# Patient Record
Sex: Female | Born: 1980 | Race: White | Hispanic: No | Smoking: Never smoker
Health system: Southern US, Community
[De-identification: ages and names within clinical notes are randomized; demographics above are authoritative.]

## PROBLEM LIST (undated history)

## (undated) DIAGNOSIS — F909 Attention-deficit hyperactivity disorder, unspecified type: Secondary | ICD-10-CM

## (undated) DIAGNOSIS — F419 Anxiety disorder, unspecified: Secondary | ICD-10-CM

## (undated) DIAGNOSIS — F32A Depression, unspecified: Secondary | ICD-10-CM

## (undated) DIAGNOSIS — F329 Major depressive disorder, single episode, unspecified: Secondary | ICD-10-CM

## (undated) DIAGNOSIS — N3010 Interstitial cystitis (chronic) without hematuria: Secondary | ICD-10-CM

## (undated) DIAGNOSIS — K219 Gastro-esophageal reflux disease without esophagitis: Secondary | ICD-10-CM

## (undated) DIAGNOSIS — M797 Fibromyalgia: Secondary | ICD-10-CM

## (undated) DIAGNOSIS — T7840XA Allergy, unspecified, initial encounter: Secondary | ICD-10-CM

## (undated) HISTORY — PX: ABDOMINAL HYSTERECTOMY: SHX81

## (undated) HISTORY — DX: Depression, unspecified: F32.A

## (undated) HISTORY — PX: CHOLECYSTECTOMY: SHX55

## (undated) HISTORY — DX: Major depressive disorder, single episode, unspecified: F32.9

## (undated) HISTORY — PX: AUGMENTATION MAMMAPLASTY: SUR837

## (undated) HISTORY — PX: ARTHROSCOPIC REPAIR ACL: SUR80

## (undated) HISTORY — DX: Allergy, unspecified, initial encounter: T78.40XA

## (undated) HISTORY — DX: Anxiety disorder, unspecified: F41.9

## (undated) HISTORY — DX: Attention-deficit hyperactivity disorder, unspecified type: F90.9

## (undated) HISTORY — PX: APPENDECTOMY: SHX54

## (undated) HISTORY — DX: Gastro-esophageal reflux disease without esophagitis: K21.9

## (undated) HISTORY — DX: Fibromyalgia: M79.7

---

## 2007-03-02 ENCOUNTER — Inpatient Hospital Stay (HOSPITAL_COMMUNITY): Admission: AD | Admit: 2007-03-02 | Discharge: 2007-03-03 | Payer: Self-pay | Admitting: Obstetrics & Gynecology

## 2007-03-13 ENCOUNTER — Emergency Department (HOSPITAL_COMMUNITY): Admission: EM | Admit: 2007-03-13 | Discharge: 2007-03-13 | Payer: Self-pay | Admitting: Emergency Medicine

## 2007-03-14 ENCOUNTER — Emergency Department (HOSPITAL_COMMUNITY): Admission: EM | Admit: 2007-03-14 | Discharge: 2007-03-15 | Payer: Self-pay | Admitting: Emergency Medicine

## 2007-04-02 ENCOUNTER — Ambulatory Visit: Payer: Self-pay | Admitting: Obstetrics & Gynecology

## 2007-04-19 ENCOUNTER — Emergency Department (HOSPITAL_COMMUNITY): Admission: EM | Admit: 2007-04-19 | Discharge: 2007-04-20 | Payer: Self-pay | Admitting: Emergency Medicine

## 2007-05-08 ENCOUNTER — Ambulatory Visit: Payer: Self-pay | Admitting: Obstetrics & Gynecology

## 2007-05-08 ENCOUNTER — Ambulatory Visit (HOSPITAL_COMMUNITY): Admission: RE | Admit: 2007-05-08 | Discharge: 2007-05-08 | Payer: Self-pay | Admitting: Obstetrics & Gynecology

## 2007-05-08 ENCOUNTER — Encounter: Payer: Self-pay | Admitting: Obstetrics & Gynecology

## 2007-05-12 ENCOUNTER — Ambulatory Visit: Payer: Self-pay | Admitting: Obstetrics & Gynecology

## 2008-06-29 ENCOUNTER — Encounter: Admission: RE | Admit: 2008-06-29 | Discharge: 2008-06-29 | Payer: Self-pay | Admitting: Sports Medicine

## 2008-07-06 ENCOUNTER — Encounter: Admission: RE | Admit: 2008-07-06 | Discharge: 2008-08-25 | Payer: Self-pay | Admitting: Sports Medicine

## 2010-12-16 DIAGNOSIS — M797 Fibromyalgia: Secondary | ICD-10-CM

## 2010-12-16 HISTORY — DX: Fibromyalgia: M79.7

## 2011-04-30 NOTE — Op Note (Signed)
[name], Erica Nichols            ACCOUNT NO.:  [id number]   MEDICAL RECORD NO.:  [id number]          PATIENT TYPE:  AMB   LOCATION:  SDC                           FACILITY:  [facility]   PHYSICIAN:  Allie Bossier, MD        DATE OF BIRTH:  29-Dec-1980   DATE OF PROCEDURE:  05/08/2007  DATE OF DISCHARGE:                               OPERATIVE REPORT   PREOPERATIVE DIAGNOSIS:  Chronic pelvic pain.   POSTOPERATIVE DIAGNOSIS:  Chronic pelvic pain.   PROCEDURE:  Diagnostic laparoscopy.   SURGEON:  Clarisa Kindred, M.D.   ANESTHESIA:  Oda Cogan, M.D.   COMPLICATIONS:  None.   EBL:  Minimal.   SPECIMENS:  Biopsy of left ureterosacral ligament.   DETAILED PROCEDURE FINDINGS:  Preoperatively, the risks, benefits and  alternatives of surgery were explained to her and accepted, consents  were signed.  She was taken to the operating room and placed in the  dorsal lithotomy position.  General anesthesia was then applied.  Her  vagina and abdomen were prepped and draped in the usual sterile fashion.  Her bladder was emptied with a Robinson catheter.  A bimanual exam  revealed a normal sized and shaped mid plane, very mobile uterus and non-  enlarged adnexa.  A Hulka manipulator was placed.  Gloves were changed,  attention was turned to the abdomen.  A vertical umbilical incision was  made, taking care to avoid the scar of her previous umbilical hernia  repair.  A Veress needle was placed.  Low flow CO2 was used to  insufflate the abdomen to 3L.  Patient abdominal pressure was always  less than 10.  A 10 mm trocar was placed.  Laparoscopy confirmed correct  placement.  She was placed in Trendelenburg position.  The pelvis was  inspected.  There were no gross abnormalities on initial exam and I  placed a 5 mm port in the midline lower pelvis under direct laparoscopic  visualization.  The pelvis was more carefully examined.  The right ovary  had a simple cyst that measured approximately 1-2 cm.   The fimbria of  her ova duct appeared normal.  There was no evidence of endometriosis on  the patient's right hand side.  There was a minimal amount of clear  fluid in the cul-de-sac.  The left ureterosacral ligament, near its  junction into the uterus, had a lesion that was questionable for  endometriosis, this was biopsied and removed in its entirety during the  course of the biopsy.  The left ovary appeared entirely normal as did  its fimbria.  Anterior cul-de-sac was normal as well.  Throughout the  peritoneum there was some moderate amount of injection of the  capillaries.  This would be consistent with her history of PID in the  past.  The upper abdomen appeared normal.  There was minimal scarring  from her previous appendectomy.  The 5 mm port was removed after  hemostasis was noted at the biopsy site.  The CO2 was allowed to escape  from the abdomen and the 10 mm port was removed.  The  fascia at the  umbilicus was closed with a #0 Vicryl suture.  A total of 10 mL of 0.5%  Marcaine was injected in the subcutaneous tissue between the 2 incision  sites.  The subcuticular closure at the umbilicus was done  with a #4-0 Vicryl running and locking suture and Dermabond was placed  at both incisions for the skin.  She was extubated and taken to the  recovery room in stable condition.  Please note the Hulka manipulator  was removed.  She tolerated the procedure well.      Allie Bossier, MD  Electronically Signed     MCD/MEDQ  D:  05/08/2007  T:  05/08/2007  Job:  [id number]

## 2012-06-01 DIAGNOSIS — Z87440 Personal history of urinary (tract) infections: Secondary | ICD-10-CM | POA: Insufficient documentation

## 2013-09-08 DIAGNOSIS — IMO0002 Reserved for concepts with insufficient information to code with codable children: Secondary | ICD-10-CM | POA: Insufficient documentation

## 2013-09-08 DIAGNOSIS — M797 Fibromyalgia: Secondary | ICD-10-CM | POA: Insufficient documentation

## 2013-09-08 DIAGNOSIS — M545 Low back pain, unspecified: Secondary | ICD-10-CM | POA: Insufficient documentation

## 2013-09-08 DIAGNOSIS — M064 Inflammatory polyarthropathy: Secondary | ICD-10-CM | POA: Insufficient documentation

## 2013-10-06 DIAGNOSIS — R5383 Other fatigue: Secondary | ICD-10-CM | POA: Insufficient documentation

## 2013-10-06 DIAGNOSIS — G4700 Insomnia, unspecified: Secondary | ICD-10-CM | POA: Insufficient documentation

## 2013-11-05 DIAGNOSIS — M2550 Pain in unspecified joint: Secondary | ICD-10-CM | POA: Insufficient documentation

## 2015-09-25 DIAGNOSIS — B009 Herpesviral infection, unspecified: Secondary | ICD-10-CM | POA: Insufficient documentation

## 2016-07-21 ENCOUNTER — Emergency Department (HOSPITAL_COMMUNITY)
Admission: EM | Admit: 2016-07-21 | Disposition: A | Discharge status: Home / Self Care | Payer: Medicaid Other | Source: Home / Self Care

## 2016-07-21 ENCOUNTER — Encounter (HOSPITAL_COMMUNITY): Payer: Self-pay

## 2016-07-21 ENCOUNTER — Emergency Department (HOSPITAL_COMMUNITY)
Admission: EM | Admit: 2016-07-21 | Discharge: 2016-07-21 | Disposition: A | Discharge status: Home / Self Care | Payer: Medicaid Other | Attending: Dermatology | Admitting: Dermatology

## 2016-07-21 DIAGNOSIS — R110 Nausea: Secondary | ICD-10-CM | POA: Insufficient documentation

## 2016-07-21 DIAGNOSIS — Z87891 Personal history of nicotine dependence: Secondary | ICD-10-CM | POA: Insufficient documentation

## 2016-07-21 DIAGNOSIS — R1011 Right upper quadrant pain: Secondary | ICD-10-CM | POA: Diagnosis present

## 2016-07-21 DIAGNOSIS — Z5321 Procedure and treatment not carried out due to patient leaving prior to being seen by health care provider: Secondary | ICD-10-CM | POA: Insufficient documentation

## 2016-07-21 LAB — URINALYSIS, ROUTINE W REFLEX MICROSCOPIC
BILIRUBIN URINE: NEGATIVE
GLUCOSE, UA: NEGATIVE mg/dL
Ketones, ur: NEGATIVE mg/dL
LEUKOCYTES UA: NEGATIVE
NITRITE: NEGATIVE
PH: 7.5 (ref 5.0–8.0)
Protein, ur: NEGATIVE mg/dL
SPECIFIC GRAVITY, URINE: 1.01 (ref 1.005–1.030)

## 2016-07-21 LAB — URINE MICROSCOPIC-ADD ON: Bacteria, UA: NONE SEEN

## 2016-07-21 NOTE — ED Notes (Signed)
No answer when called to patient room 

## 2016-07-21 NOTE — ED Triage Notes (Signed)
No answer when called to patient room 

## 2016-07-21 NOTE — ED Triage Notes (Signed)
RUQ pain with nausea. Patient states no relief with OTC medications.

## 2016-07-22 ENCOUNTER — Encounter (HOSPITAL_COMMUNITY): Payer: Self-pay | Admitting: *Deleted

## 2016-07-22 ENCOUNTER — Emergency Department (HOSPITAL_COMMUNITY)
Admission: EM | Admit: 2016-07-22 | Discharge: 2016-07-22 | Disposition: A | Discharge status: Home / Self Care | Payer: Medicaid Other | Attending: Emergency Medicine | Admitting: Emergency Medicine

## 2016-07-22 ENCOUNTER — Emergency Department (HOSPITAL_COMMUNITY): Payer: Medicaid Other

## 2016-07-22 DIAGNOSIS — R1011 Right upper quadrant pain: Secondary | ICD-10-CM | POA: Diagnosis present

## 2016-07-22 DIAGNOSIS — Z87891 Personal history of nicotine dependence: Secondary | ICD-10-CM | POA: Diagnosis not present

## 2016-07-22 DIAGNOSIS — Z79899 Other long term (current) drug therapy: Secondary | ICD-10-CM | POA: Diagnosis not present

## 2016-07-22 DIAGNOSIS — R0789 Other chest pain: Secondary | ICD-10-CM

## 2016-07-22 HISTORY — DX: Interstitial cystitis (chronic) without hematuria: N30.10

## 2016-07-22 LAB — URINE MICROSCOPIC-ADD ON
Bacteria, UA: NONE SEEN
WBC, UA: NONE SEEN WBC/hpf (ref 0–5)

## 2016-07-22 LAB — CBC WITH DIFFERENTIAL/PLATELET
BASOS PCT: 0 %
Basophils Absolute: 0 10*3/uL (ref 0.0–0.1)
Eosinophils Absolute: 0.2 10*3/uL (ref 0.0–0.7)
Eosinophils Relative: 2 %
HEMATOCRIT: 42.6 % (ref 36.0–46.0)
Hemoglobin: 14.7 g/dL (ref 12.0–15.0)
LYMPHS PCT: 43 %
Lymphs Abs: 2.7 10*3/uL (ref 0.7–4.0)
MCH: 29.7 pg (ref 26.0–34.0)
MCHC: 34.5 g/dL (ref 30.0–36.0)
MCV: 86.1 fL (ref 78.0–100.0)
MONO ABS: 0.5 10*3/uL (ref 0.1–1.0)
MONOS PCT: 7 %
NEUTROS ABS: 2.9 10*3/uL (ref 1.7–7.7)
Neutrophils Relative %: 48 %
Platelets: 259 10*3/uL (ref 150–400)
RBC: 4.95 MIL/uL (ref 3.87–5.11)
RDW: 12.1 % (ref 11.5–15.5)
WBC: 6.1 10*3/uL (ref 4.0–10.5)

## 2016-07-22 LAB — URINALYSIS, ROUTINE W REFLEX MICROSCOPIC
GLUCOSE, UA: NEGATIVE mg/dL
KETONES UR: NEGATIVE mg/dL
Leukocytes, UA: NEGATIVE
Nitrite: NEGATIVE
PH: 5.5 (ref 5.0–8.0)
Protein, ur: NEGATIVE mg/dL
Specific Gravity, Urine: 1.02 (ref 1.005–1.030)

## 2016-07-22 LAB — COMPREHENSIVE METABOLIC PANEL
ALK PHOS: 62 U/L (ref 38–126)
ALT: 18 U/L (ref 14–54)
AST: 17 U/L (ref 15–41)
Albumin: 4.3 g/dL (ref 3.5–5.0)
Anion gap: 4 — ABNORMAL LOW (ref 5–15)
BUN: 18 mg/dL (ref 6–20)
CHLORIDE: 106 mmol/L (ref 101–111)
CO2: 27 mmol/L (ref 22–32)
CREATININE: 0.92 mg/dL (ref 0.44–1.00)
Calcium: 8.6 mg/dL — ABNORMAL LOW (ref 8.9–10.3)
GFR calc Af Amer: >60 mL/min (ref >60)
Glucose, Bld: 88 mg/dL (ref 65–99)
Potassium: 3.8 mmol/L (ref 3.5–5.1)
Sodium: 137 mmol/L (ref 135–145)
TOTAL PROTEIN: 7.4 g/dL (ref 6.5–8.1)
Total Bilirubin: 0.7 mg/dL (ref 0.3–1.2)

## 2016-07-22 LAB — PREGNANCY, URINE: Preg Test, Ur: NEGATIVE

## 2016-07-22 LAB — LIPASE, BLOOD: Lipase: 34 U/L (ref 11–51)

## 2016-07-22 MED ORDER — PROMETHAZINE HCL 25 MG PO TABS: 25.0000 mg | ORAL_TABLET | Freq: Four times a day (QID) | ORAL | 1 refills | Status: DC | PRN

## 2016-07-22 MED ORDER — SODIUM CHLORIDE 0.9 % IV BOLUS (SEPSIS)
1000.0000 mL | Freq: Once | INTRAVENOUS | Status: AC
  Administered 2016-07-22: 1000 mL via INTRAVENOUS

## 2016-07-22 MED ORDER — IOPAMIDOL (ISOVUE-300) INJECTION 61%
100.0000 mL | Freq: Once | INTRAVENOUS | Status: AC | PRN
  Administered 2016-07-22: 100 mL via INTRAVENOUS

## 2016-07-22 MED ORDER — SODIUM CHLORIDE 0.9 % IV BOLUS (SEPSIS)
500.0000 mL | Freq: Once | INTRAVENOUS | Status: AC
  Administered 2016-07-22: 500 mL via INTRAVENOUS

## 2016-07-22 MED ORDER — HYDROCODONE-ACETAMINOPHEN 5-325 MG PO TABS: 1.0000 | ORAL_TABLET | Freq: Four times a day (QID) | ORAL | 0 refills | Status: DC | PRN

## 2016-07-22 MED ORDER — DIATRIZOATE MEGLUMINE & SODIUM 66-10 % PO SOLN
ORAL | Status: AC
  Filled 2016-07-22: qty 30

## 2016-07-22 MED ORDER — FENTANYL CITRATE (PF) 100 MCG/2ML IJ SOLN
50.0000 ug | Freq: Once | INTRAMUSCULAR | Status: AC
  Administered 2016-07-22: 50 ug via INTRAVENOUS
  Filled 2016-07-22: qty 2

## 2016-07-22 MED ORDER — ONDANSETRON HCL 4 MG/2ML IJ SOLN
4.0000 mg | Freq: Once | INTRAMUSCULAR | Status: AC
  Administered 2016-07-22: 4 mg via INTRAVENOUS
  Filled 2016-07-22: qty 2

## 2016-07-22 MED ORDER — NAPROXEN 500 MG PO TABS
500.0000 mg | ORAL_TABLET | Freq: Two times a day (BID) | ORAL | 1 refills | Status: DC
Start: 2016-07-22 — End: 2019-02-03

## 2016-07-22 NOTE — ED Provider Notes (Signed)
Patient turned over to me. CT scan without any significant findings. Patient is tender over the anterior lower part of her right ribs. This may represent some cartilage inflammation. CT scan shows no evidence of any hernia or mass in that area. Symptoms not consistent with peptic ulcer disease. Will treat patient symptomatically and have her follow-up with her regular doctor. Patient's labs without any significant abnormalities.  Results for orders placed or performed during the hospital encounter of 07/22/16  Comprehensive metabolic panel  Result Value Ref Range   Sodium 137 135 - 145 mmol/L   Potassium 3.8 3.5 - 5.1 mmol/L   Chloride 106 101 - 111 mmol/L   CO2 27 22 - 32 mmol/L   Glucose, Bld 88 65 - 99 mg/dL   BUN 18 6 - 20 mg/dL   Creatinine, Ser 4.09 0.44 - 1.00 mg/dL   Calcium 8.6 (L) 8.9 - 10.3 mg/dL   Total Protein 7.4 6.5 - 8.1 g/dL   Albumin 4.3 3.5 - 5.0 g/dL   AST 17 15 - 41 U/L   ALT 18 14 - 54 U/L   Alkaline Phosphatase 62 38 - 126 U/L   Total Bilirubin 0.7 0.3 - 1.2 mg/dL   GFR calc non Af Amer >60 >60 mL/min   GFR calc Af Amer >60 >60 mL/min   Anion gap 4 (L) 5 - 15  Lipase, blood  Result Value Ref Range   Lipase 34 11 - 51 U/L  CBC with Differential  Result Value Ref Range   WBC 6.1 4.0 - 10.5 K/uL   RBC 4.95 3.87 - 5.11 MIL/uL   Hemoglobin 14.7 12.0 - 15.0 g/dL   HCT 81.1 91.4 - 78.2 %   MCV 86.1 78.0 - 100.0 fL   MCH 29.7 26.0 - 34.0 pg   MCHC 34.5 30.0 - 36.0 g/dL   RDW 95.6 21.3 - 08.6 %   Platelets 259 150 - 400 K/uL   Neutrophils Relative % 48 %   Neutro Abs 2.9 1.7 - 7.7 K/uL   Lymphocytes Relative 43 %   Lymphs Abs 2.7 0.7 - 4.0 K/uL   Monocytes Relative 7 %   Monocytes Absolute 0.5 0.1 - 1.0 K/uL   Eosinophils Relative 2 %   Eosinophils Absolute 0.2 0.0 - 0.7 K/uL   Basophils Relative 0 %   Basophils Absolute 0.0 0.0 - 0.1 K/uL  Urinalysis, Routine w reflex microscopic  Result Value Ref Range   Color, Urine GREEN (A) YELLOW   APPearance CLEAR  CLEAR   Specific Gravity, Urine 1.020 1.005 - 1.030   pH 5.5 5.0 - 8.0   Glucose, UA NEGATIVE NEGATIVE mg/dL   Hgb urine dipstick TRACE (A) NEGATIVE   Bilirubin Urine SMALL (A) NEGATIVE   Ketones, ur NEGATIVE NEGATIVE mg/dL   Protein, ur NEGATIVE NEGATIVE mg/dL   Nitrite NEGATIVE NEGATIVE   Leukocytes, UA NEGATIVE NEGATIVE  Urine microscopic-add on  Result Value Ref Range   Squamous Epithelial / LPF 0-5 (A) NONE SEEN   WBC, UA NONE SEEN 0 - 5 WBC/hpf   RBC / HPF 0-5 0 - 5 RBC/hpf   Bacteria, UA NONE SEEN NONE SEEN  Pregnancy, urine  Result Value Ref Range   Preg Test, Ur NEGATIVE NEGATIVE   Ct Abdomen Pelvis W Contrast  Result Date: 07/22/2016 CLINICAL DATA:  Onset of abdominal pain 0130 hours yesterday, onset of RIGHT upper quadrant pain 24 hours ago, sharp pain lumbar dull pain lasting a few minutes to an hour with  associated nausea and chills, pain worsened by movement and eating, post cholecystectomy, mass just under RIGHT rib cage on exam EXAM: CT ABDOMEN AND PELVIS WITH CONTRAST TECHNIQUE: Multidetector CT imaging of the abdomen and pelvis was performed using the standard protocol following bolus administration of intravenous contrast. Sagittal and coronal MPR images reconstructed from axial data set. CONTRAST:  100mL ISOVUE-300 IOPAMIDOL (ISOVUE-300) INJECTION 61% IV. Dilute oral contrast. COMPARISON:  03/14/2007 FINDINGS: Lower chest:  Lung bases clear Hepatobiliary: Post cholecystectomy. Two tiny nonspecific low to intermediate attenuation foci in lateral segment LEFT lobe liver new since previous exam, larger 4 mm diameter image 21. Liver otherwise normal appearance. No biliary dilatation. Pancreas: Normal appearance Spleen: Normal appearance Adrenals/Urinary Tract: Adrenal glands normal appearance. Small nonobstructing calculi at inferior poles of both kidneys 4 mm diameter RIGHT and 2 mm diameter LEFT. No hydronephrosis, ureteral calcification or ureteral dilatation. Kidneys,  ureters, and bladder otherwise normal appearance. Stomach/Bowel: Appendix surgically absent. Stomach and bowel loops normal appearance. Vascular/Lymphatic: Aorta normal caliber.  No adenopathy. Reproductive: Uterus surgically absent with nonvisualization of ovaries. Other: No free air or free fluid. No hernia. BILATERAL breast prostheses. Musculoskeletal: Osseous structures normal appearance. IMPRESSION: Post cholecystectomy, appendectomy, hysterectomy and BILATERAL salpingo-oophorectomy. BILATERAL small nonobstructing calculi at the inferior poles of both kidneys. Two tiny nonspecific foci in lateral segment LEFT lobe liver larger 4 mm diameter, potentially tiny cysts. Electronically Signed   By: Ulyses SouthwardMark  Boles M.D.   On: 07/22/2016 08:17      Vanetta MuldersScott Naia Ruff, MD 07/22/16 [phone]

## 2016-07-22 NOTE — ED Triage Notes (Signed)
Pt c/o intermittent right upper quad abd pain with a "knot", n/v, and headache that started yesterday,

## 2016-07-22 NOTE — Discharge Instructions (Signed)
Take the pain medication as needed. Take the Naprosyn on a regular basis. Take Phenergan as needed for nausea and vomiting. Try to rest as much as possible. Return for any new or worse symptoms. Make appointment to follow-up with your doctor. Suspect that the pain is coming from the chest wall area may represent rib pain from cartilage part of the ribs.

## 2016-07-22 NOTE — ED Provider Notes (Signed)
[facility]Y DEPT Provider Note   CSN: [id number] Arrival date & time: 07/22/16  1308  First Provider Contact:  First MD Initiated Contact with Patient 07/22/16 [phone]        History   Chief Complaint Chief Complaint  Patient presents with  . Abdominal Pain    HPI Erica Nichols is a 35 y.o. female.  HPI patient states about 1:30 AM yesterday morning, 24 hours ago she started having some right upper quadrant pain that radiates into her back. She states during the attacks the pain is sharp and then there is a dull pain. She states it lasts for a few minutes up to an hour. She has had nausea without vomiting, no known fever but some chills. She states movement and eating food seems to make it hurt more, applying pressure to the area or standing on her hands and knees makes the pain get better. Her husband reports he felt a swollen area in her right upper quadrant yesterday and it feels like it's getting bigger. Patient has a long history of GERD but is not having acid reflux tonight. She is status post cholecystectomy in 2014 and states this pain feels similar. She also is status post appendectomy, hysterectomy, bilateral inguinal hernia repair, and periumbilical hernia repair. Patient also has a history of interstitial cystitis. She also complains of diffuse pressure in her head.   PCP PA Judie Grieve in [place], [place]  Past Medical History:  Diagnosis Date  . Interstitial cystitis     There are no active problems to display for this patient.   Past Surgical History:  Procedure Laterality Date  . ABDOMINAL HYSTERECTOMY    . APPENDECTOMY    . CHOLECYSTECTOMY      OB History    No data available       Home Medications    Prior to Admission medications   Medication Sig Start Date End Date Taking? Authorizing Provider  estrogens, conjugated, (PREMARIN) 0.3 MG tablet Take 0.3 mg by mouth daily. Take daily for 21 days then do not take for 7 days.   Yes  Historical Provider, MD  traMADol (ULTRAM) 50 MG tablet Take by mouth 2 (two) times daily.   Yes Historical Provider, MD    Family History No family history on file.  Social History Social History  Substance Use Topics  . Smoking status: Former Games developer  . Smokeless tobacco: Never Used  . Alcohol use No  employed Lives with spouse   Allergies   Latex; Macrobid [nitrofurantoin monohyd macro]; and Sulfa antibiotics   Review of Systems Review of Systems  All other systems reviewed and are negative.    Physical Exam Updated Vital Signs BP 107/68   Pulse 60   Temp 98 F (36.7 C) (Oral)   Resp 20   Ht  (1.575 m)   Wt 147 lb (66.7 kg)   SpO2 98%   BMI 26.89 kg/m   Vital signs normal    Physical Exam  Constitutional: She is oriented to person, place, and time. She appears well-developed and well-nourished.  Non-toxic appearance. She does not appear ill. No distress.  HENT:  Head: Normocephalic and atraumatic.  Right Ear: External ear normal.  Left Ear: External ear normal.  Nose: Nose normal. No mucosal edema or rhinorrhea.  Mouth/Throat: Oropharynx is clear and moist and mucous membranes are normal. No dental abscesses or uvula swelling.  Eyes: Conjunctivae and EOM are normal. Pupils are equal, round, and reactive to light.  Neck: Normal range of motion and full passive range of motion without pain. Neck supple.  Cardiovascular: Normal rate, regular rhythm and normal heart sounds.  Exam reveals no gallop and no friction rub.   No murmur heard. Pulmonary/Chest: Effort normal and breath sounds normal. No respiratory distress. She has no wheezes. She has no rhonchi. She has no rales. She exhibits no tenderness and no crepitus.  Abdominal: Soft. Normal appearance and bowel sounds are normal. She exhibits mass. She exhibits no distension. There is tenderness. There is no rebound and no guarding.    There does appear to be a area of fullness just underneath her right  lower costochondral junction in the area of the right upper quadrant. It does not get bigger when she strains however she states that makes it hurt more.  Musculoskeletal: Normal range of motion. She exhibits no edema or tenderness.  Moves all extremities well.   Neurological: She is alert and oriented to person, place, and time. She has normal strength. No cranial nerve deficit.  Skin: Skin is warm, dry and intact. No rash noted. No erythema. No pallor.  Psychiatric: She has a normal mood and affect. Her speech is normal and behavior is normal. Her mood appears not anxious.  Nursing note and vitals reviewed.    ED Treatments / Results  Labs (all labs ordered are listed, but only abnormal results are displayed) Results for orders placed or performed during the hospital encounter of 07/22/16  Comprehensive metabolic panel  Result Value Ref Range   Sodium 137 135 - 145 mmol/L   Potassium 3.8 3.5 - 5.1 mmol/L   Chloride 106 101 - 111 mmol/L   CO2 27 22 - 32 mmol/L   Glucose, Bld 88 65 - 99 mg/dL   BUN 18 6 - 20 mg/dL   Creatinine, Ser 4.09 0.44 - 1.00 mg/dL   Calcium 8.6 (L) 8.9 - 10.3 mg/dL   Total Protein 7.4 6.5 - 8.1 g/dL   Albumin 4.3 3.5 - 5.0 g/dL   AST 17 15 - 41 U/L   ALT 18 14 - 54 U/L   Alkaline Phosphatase 62 38 - 126 U/L   Total Bilirubin 0.7 0.3 - 1.2 mg/dL   GFR calc non Af Amer >60 >60 mL/min   GFR calc Af Amer >60 >60 mL/min   Anion gap 4 (L) 5 - 15  Lipase, blood  Result Value Ref Range   Lipase 34 11 - 51 U/L  CBC with Differential  Result Value Ref Range   WBC 6.1 4.0 - 10.5 K/uL   RBC 4.95 3.87 - 5.11 MIL/uL   Hemoglobin 14.7 12.0 - 15.0 g/dL   HCT 81.1 91.4 - 78.2 %   MCV 86.1 78.0 - 100.0 fL   MCH 29.7 26.0 - 34.0 pg   MCHC 34.5 30.0 - 36.0 g/dL   RDW 95.6 21.3 - 08.6 %   Platelets 259 150 - 400 K/uL   Neutrophils Relative % 48 %   Neutro Abs 2.9 1.7 - 7.7 K/uL   Lymphocytes Relative 43 %   Lymphs Abs 2.7 0.7 - 4.0 K/uL   Monocytes Relative 7 %     Monocytes Absolute 0.5 0.1 - 1.0 K/uL   Eosinophils Relative 2 %   Eosinophils Absolute 0.2 0.0 - 0.7 K/uL   Basophils Relative 0 %   Basophils Absolute 0.0 0.0 - 0.1 K/uL  Urinalysis, Routine w reflex microscopic  Result Value Ref Range   Color, Urine GREEN (A) YELLOW  APPearance CLEAR CLEAR   Specific Gravity, Urine 1.020 1.005 - 1.030   pH 5.5 5.0 - 8.0   Glucose, UA NEGATIVE NEGATIVE mg/dL   Hgb urine dipstick TRACE (A) NEGATIVE   Bilirubin Urine SMALL (A) NEGATIVE   Ketones, ur NEGATIVE NEGATIVE mg/dL   Protein, ur NEGATIVE NEGATIVE mg/dL   Nitrite NEGATIVE NEGATIVE   Leukocytes, UA NEGATIVE NEGATIVE  Urine microscopic-add on  Result Value Ref Range   Squamous Epithelial / LPF 0-5 (A) NONE SEEN   WBC, UA NONE SEEN 0 - 5 WBC/hpf   RBC / HPF 0-5 0 - 5 RBC/hpf   Bacteria, UA NONE SEEN NONE SEEN  Pregnancy, urine  Result Value Ref Range   Preg Test, Ur NEGATIVE NEGATIVE   Laboratory interpretation all normal     EKG  EKG Interpretation None       Radiology No results found.  Procedures Procedures (including critical care time)  Medications Ordered in ED Medications  sodium chloride 0.9 % bolus 500 mL (not administered)  iopamidol (ISOVUE-300) 61 % injection 100 mL (not administered)  diatrizoate meglumine-sodium (GASTROGRAFIN) 66-10 % solution (not administered)  sodium chloride 0.9 % bolus 1,000 mL (0 mLs Intravenous Stopped 07/22/16 0720)  fentaNYL (SUBLIMAZE) injection 50 mcg (50 mcg Intravenous Given 07/22/16 0555)  ondansetron (ZOFRAN) injection 4 mg (4 mg Intravenous Given 07/22/16 0555)     Initial Impression / Assessment and Plan / ED Course  I have reviewed the triage vital signs and the nursing notes.  Pertinent labs & imaging results that were available during my care of the patient were reviewed by me and considered in my medical decision making (see chart for details).  Clinical Course   IV was inserted and patient was given IV fluids and IV  pain and nausea medication. CT scan of the abdomen was ordered to look for a possible hernia status post her cholecystectomy or other etiology of her pain.  7:24 AM patient just finished drinking her contrast. She was given her lab results which were normal. She will be left at change of shift to Dr Deretha EmoryZackowski to get the results of her CT scan.  Review of the [place] database shows she received #60 [facility]. She received a hydrocodone cough syrup on April 29.  Final Clinical Impressions(s) / ED Diagnoses   Final diagnoses:  RUQ pain   Disposition pending  Devoria AlbeIva Yoona Ishii, MD, Concha PyoFACEP     Jamice Carreno, MD 07/22/16 [phone]

## 2016-07-23 ENCOUNTER — Encounter (HOSPITAL_COMMUNITY): Payer: Self-pay | Admitting: *Deleted

## 2016-08-16 HISTORY — PX: ERCP: SHX60

## 2019-02-03 ENCOUNTER — Ambulatory Visit (INDEPENDENT_AMBULATORY_CARE_PROVIDER_SITE_OTHER): Payer: BC Managed Care – PPO | Admitting: Family Medicine

## 2019-02-03 ENCOUNTER — Encounter: Payer: Self-pay | Admitting: Family Medicine

## 2019-02-03 VITALS — BP 108/74 | HR 72 | Temp 98.5°F | Ht 62.0 in | Wt 156.6 lb

## 2019-02-03 DIAGNOSIS — J329 Chronic sinusitis, unspecified: Secondary | ICD-10-CM

## 2019-02-03 DIAGNOSIS — J40 Bronchitis, not specified as acute or chronic: Secondary | ICD-10-CM

## 2019-02-03 DIAGNOSIS — J069 Acute upper respiratory infection, unspecified: Secondary | ICD-10-CM

## 2019-02-03 MED ORDER — BETAMETHASONE SOD PHOS & ACET 6 (3-3) MG/ML IJ SUSP
6.0000 mg | Freq: Once | INTRAMUSCULAR | Status: AC
  Administered 2019-02-03: 6 mg via INTRAMUSCULAR

## 2019-02-03 MED ORDER — AMOXICILLIN-POT CLAVULANATE 875-125 MG PO TABS: 1.0000 | ORAL_TABLET | Freq: Two times a day (BID) | ORAL | 0 refills | Status: DC

## 2019-02-03 NOTE — Progress Notes (Signed)
Chief Complaint  Patient presents with  . New Patient (Initial Visit)  . Headache  . Ear Pain  . Sore Throat  . Fever    103    HPI  Patient presents today for new patient visit. She is hoarse, Patient presents with upper respiratory congestion. Rhinorrhea that is frequently purulent. There is moderate sore throat. Patient reports coughing frequently as well.  yllow sputum noted. There is no fever, chills or sweats. The patient denies being short of breath. Onset was 3-5 days ago. Gradually worsening. Tried OTCs without improvement.   PMH: Smoking status noted ROS: Per HPI  Objective: BP 108/74   Pulse 72   Temp 98.5 F (36.9 C) (Oral)   Ht 5\' 2"  (1.575 m)   Wt 156 lb 9.6 oz (71 kg)   BMI 28.64 kg/m  Gen: NAD, alert, cooperative with exam HEENT: NCAT, EOMI, PERRL CV: RRR, good S1/S2, no murmur Resp: CTABL, no wheezes, non-labored Abd: SNTND, BS present, no guarding or organomegaly Ext: No edema, warm Neuro: Alert and oriented, No gross deficits  Assessment and plan:  1. Sinobronchitis   2. Upper respiratory tract infection, unspecified type     Meds ordered this encounter  Medications  . amoxicillin-clavulanate (AUGMENTIN) 875-125 MG tablet    Sig: Take 1 tablet by mouth 2 (two) times daily. Take all of this medication    Dispense:  20 tablet    Refill:  0  . betamethasone acetate-betamethasone sodium phosphate (CELESTONE) injection 6 mg    Follow up as needed.  Mechele Claude, MD

## 2019-02-08 ENCOUNTER — Encounter: Payer: Self-pay | Admitting: Family Medicine

## 2019-02-08 ENCOUNTER — Ambulatory Visit (INDEPENDENT_AMBULATORY_CARE_PROVIDER_SITE_OTHER): Payer: BC Managed Care – PPO | Admitting: Family Medicine

## 2019-02-08 VITALS — BP 105/70 | HR 72 | Temp 97.1°F | Ht 63.0 in | Wt 154.2 lb

## 2019-02-08 DIAGNOSIS — F988 Other specified behavioral and emotional disorders with onset usually occurring in childhood and adolescence: Secondary | ICD-10-CM | POA: Insufficient documentation

## 2019-02-08 DIAGNOSIS — Z0000 Encounter for general adult medical examination without abnormal findings: Secondary | ICD-10-CM

## 2019-02-08 DIAGNOSIS — F329 Major depressive disorder, single episode, unspecified: Secondary | ICD-10-CM | POA: Insufficient documentation

## 2019-02-08 DIAGNOSIS — M797 Fibromyalgia: Secondary | ICD-10-CM

## 2019-02-08 DIAGNOSIS — N3010 Interstitial cystitis (chronic) without hematuria: Secondary | ICD-10-CM | POA: Diagnosis not present

## 2019-02-08 DIAGNOSIS — Z0001 Encounter for general adult medical examination with abnormal findings: Secondary | ICD-10-CM | POA: Diagnosis not present

## 2019-02-08 DIAGNOSIS — F419 Anxiety disorder, unspecified: Secondary | ICD-10-CM | POA: Insufficient documentation

## 2019-02-08 DIAGNOSIS — F32A Depression, unspecified: Secondary | ICD-10-CM | POA: Insufficient documentation

## 2019-02-08 DIAGNOSIS — F4310 Post-traumatic stress disorder, unspecified: Secondary | ICD-10-CM | POA: Insufficient documentation

## 2019-02-08 DIAGNOSIS — R5382 Chronic fatigue, unspecified: Secondary | ICD-10-CM

## 2019-02-08 LAB — URINALYSIS
Bilirubin, UA: NEGATIVE
Glucose, UA: NEGATIVE
Ketones, UA: NEGATIVE
LEUKOCYTES UA: NEGATIVE
Nitrite, UA: NEGATIVE
PH UA: 5.5 (ref 5.0–7.5)
PROTEIN UA: NEGATIVE
Specific Gravity, UA: 1.02 (ref 1.005–1.030)
Urobilinogen, Ur: 0.2 mg/dL (ref 0.2–1.0)

## 2019-02-08 NOTE — Progress Notes (Signed)
Subjective:  Patient ID: Erica Nichols, female    DOB: 1981-12-06  Age: 38 y.o. MRN: [id number]  CC: Establish Care   HPI Erica Nichols presents for a visit to establish for care.  She was scheduled for this a few days ago but had to be seen for a sick visit instead.  She is doing better from the cough and so forth as well.  Patient has a history of interstitial cystitis for which she sees Dr. Tamala Julian who prescribed Uribel for her.  Currently she is not having problems from her condition.  She tends to avoid drinking water to prevent problems.  She has had a stress echocardiogram with [place] [facility].  She has a history of fibromyalgia which gives her chronically decreased energy.  She experiences multiple frequent body aches.  She also reports decreased sleep quantity and quality.  Depression screen [facility] 2/9 02/08/2019 02/03/2019  Decreased Interest 0 0  Down, Depressed, Hopeless 0 0  PHQ - 2 Score 0 0    History Erica Nichols has a past medical history of Acid reflux, ADHD, Allergy, Anxiety, Depression, Fibromyalgia (2012), and Interstitial cystitis.   She has a past surgical history that includes Appendectomy; Cholecystectomy; Abdominal hysterectomy; and ERCP (08/2016).   Her family history includes ADD / ADHD in her brother, daughter, and daughter; Alcohol abuse in her maternal grandfather; Arthritis in her maternal grandmother; Asthma in her daughter; COPD in her maternal grandmother; Cancer in her maternal grandmother; Depression in her mother; Diabetes in her father and maternal grandmother; Drug abuse in her brother and mother; Heart disease in her maternal grandmother; Hyperlipidemia in her maternal grandmother; Hypertension in her maternal grandmother and mother; Learning disabilities in her brother and daughter; Mood Disorder in her daughter.She reports that she has never smoked. She has never used smokeless tobacco. She reports that she does not drink  alcohol or use drugs.    ROS Review of Systems  Constitutional: Positive for fatigue.  HENT: Negative for congestion.   Eyes: Negative for visual disturbance.  Respiratory: Negative for shortness of breath.   Cardiovascular: Negative for chest pain.  Gastrointestinal: Positive for constipation (Chronic and intermittent.). Negative for abdominal pain, diarrhea, nausea and vomiting.  Genitourinary: Negative for difficulty urinating.  Musculoskeletal: Positive for myalgias. Negative for arthralgias.  Neurological: Positive for weakness (Nonfocal). Negative for headaches.  Psychiatric/Behavioral: Negative for sleep disturbance.    Objective:  BP 105/70   Pulse 72   Temp (!) 97.1 F (36.2 C) (Oral)   Ht '5\' 3"'  (1.6 m)   Wt 154 lb 4 oz (70 kg)   BMI 27.32 kg/m   BP Readings from Last 3 Encounters:  02/08/19 105/70  02/03/19 108/74  07/22/16 111/68    Wt Readings from Last 3 Encounters:  02/08/19 154 lb 4 oz (70 kg)  02/03/19 156 lb 9.6 oz (71 kg)  07/22/16 147 lb (66.7 kg)     Physical Exam Constitutional:      General: She is not in acute distress.    Appearance: She is well-developed.  HENT:     Head: Normocephalic and atraumatic.     Right Ear: External ear normal.     Left Ear: External ear normal.     Nose: Nose normal.  Eyes:     Conjunctiva/sclera: Conjunctivae normal.     Pupils: Pupils are equal, round, and reactive to light.  Neck:     Musculoskeletal: Normal range of motion and neck supple.     Thyroid:  No thyromegaly.  Cardiovascular:     Rate and Rhythm: Normal rate and regular rhythm.     Heart sounds: Normal heart sounds. No murmur.  Pulmonary:     Effort: Pulmonary effort is normal. No respiratory distress.     Breath sounds: Normal breath sounds. No wheezing or rales.  Abdominal:     General: Bowel sounds are normal. There is no distension.     Palpations: Abdomen is soft.     Tenderness: There is no abdominal tenderness.  Lymphadenopathy:       Cervical: No cervical adenopathy.  Skin:    General: Skin is warm and dry.  Neurological:     Mental Status: She is alert and oriented to person, place, and time.     Deep Tendon Reflexes: Reflexes are normal and symmetric.  Psychiatric:        Behavior: Behavior normal.        Thought Content: Thought content normal.        Judgment: Judgment normal.       Assessment & Plan:   Aleina was seen today for establish care.  Diagnoses and all orders for this visit:  Well adult exam -     CBC with Differential/Platelet -     CMP14+EGFR -     Lipid panel -     Vitamin B12 -     VITAMIN D 25 Hydroxy (Vit-D Deficiency, Fractures) -     TSH -     Urinalysis -     Folate  Chronic fatigue  Fibromyalgia  Interstitial cystitis       I am having [name] maintain her estradiol, valACYclovir, and amoxicillin-clavulanate.  Allergies as of 02/08/2019      Reactions   Sulfa Antibiotics Shortness Of Breath, Rash   Macrobid [nitrofurantoin Monohyd Macro] Nausea And Vomiting   Latex Itching, Rash      Medication List       Accurate as of February 08, 2019 11:59 PM. Always use your most recent med list.        amoxicillin-clavulanate 875-125 MG tablet Commonly known as:  AUGMENTIN Take 1 tablet by mouth 2 (two) times daily. Take all of this medication   estradiol 0.5 MG tablet Commonly known as:  ESTRACE Take by mouth.   valACYclovir 500 MG tablet Commonly known as:  VALTREX Take by mouth.        Follow-up: Return in about 3 months (around 05/09/2019).  Erica Nichols, M.D.

## 2019-02-08 NOTE — Patient Instructions (Signed)
Metamucil 1 tablespoon twice daily.  Get over the counter flonase-nasal saline  Drink 64 oz of water daily   Heart-Healthy Eating Plan Heart-healthy meal planning includes:  Eating less unhealthy fats.  Eating more healthy fats.  Making other changes in your diet. Talk with your doctor or a diet specialist (dietitian) to create an eating plan that is right for you. What is my plan? Your doctor may recommend an eating plan that includes:  Total fat: ______% or less of total calories a day.  Saturated fat: ______% or less of total calories a day.  Cholesterol: less than _________mg a day. What are tips for following this plan? Cooking Avoid frying your food. Try to bake, boil, grill, or broil it instead. You can also reduce fat by:  Removing the skin from poultry.  Removing all visible fats from meats.  Steaming vegetables in water or broth. Meal planning   At meals, divide your plate into four equal parts: ? Fill one-half of your plate with vegetables and green salads. ? Fill one-fourth of your plate with whole grains. ? Fill one-fourth of your plate with lean protein foods.  Eat 4-5 servings of vegetables per day. A serving of vegetables is: ? 1 cup of raw or cooked vegetables. ? 2 cups of raw leafy greens.  Eat 4-5 servings of fruit per day. A serving of fruit is: ? 1 medium whole fruit. ?  cup of dried fruit. ?  cup of fresh, frozen, or canned fruit. ?  cup of 100% fruit juice.  Eat more foods that have soluble fiber. These are apples, broccoli, carrots, beans, peas, and barley. Try to get 20-30 g of fiber per day.  Eat 4-5 servings of nuts, legumes, and seeds per week: ? 1 serving of dried beans or legumes equals  cup after being cooked. ? 1 serving of nuts is  cup. ? 1 serving of seeds equals 1 tablespoon. General information  Eat more home-cooked food. Eat less restaurant, buffet, and fast food.  Limit or avoid alcohol.  Limit foods that are  high in starch and sugar.  Avoid fried foods.  Lose weight if you are overweight.  Keep track of how much salt (sodium) you eat. This is important if you have high blood pressure. Ask your doctor to tell you more about this.  Try to add vegetarian meals each week. Fats  Choose healthy fats. These include olive oil and canola oil, flaxseeds, walnuts, almonds, and seeds.  Eat more omega-3 fats. These include salmon, mackerel, sardines, tuna, flaxseed oil, and ground flaxseeds. Try to eat fish at least 2 times each week.  Check food labels. Avoid foods with trans fats or high amounts of saturated fat.  Limit saturated fats. ? These are often found in animal products, such as meats, butter, and cream. ? These are also found in plant foods, such as palm oil, palm kernel oil, and coconut oil.  Avoid foods with partially hydrogenated oils in them. These have trans fats. Examples are stick margarine, some tub margarines, cookies, crackers, and other baked goods. What foods can I eat? Fruits All fresh, canned (in natural juice), or frozen fruits. Vegetables Fresh or frozen vegetables (raw, steamed, roasted, or grilled). Green salads. Grains Most grains. Choose whole wheat and whole grains most of the time. Rice and pasta, including brown rice and pastas made with whole wheat. Meats and other proteins Lean, well-trimmed beef, veal, pork, and lamb. Chicken and [place] without skin. All fish and shellfish.  Wild duck, rabbit, pheasant, and venison. Egg whites or low-cholesterol egg substitutes. Dried beans, peas, lentils, and tofu. Seeds and most nuts. Dairy Low-fat or nonfat cheeses, including ricotta and mozzarella. Skim or 1% milk that is liquid, powdered, or evaporated. Buttermilk that is made with low-fat milk. Nonfat or low-fat yogurt. Fats and oils Non-hydrogenated (trans-free) margarines. Vegetable oils, including soybean, sesame, sunflower, olive, peanut, safflower, corn, canola, and  cottonseed. Salad dressings or mayonnaise made with a vegetable oil. Beverages Mineral water. Coffee and tea. Diet carbonated beverages. Sweets and desserts Sherbet, gelatin, and fruit ice. Small amounts of dark chocolate. Limit all sweets and desserts. Seasonings and condiments All seasonings and condiments. The items listed above may not be a complete list of foods and drinks you can eat. Contact a dietitian for more options. What foods should I avoid? Fruits Canned fruit in heavy syrup. Fruit in cream or butter sauce. Fried fruit. Limit coconut. Vegetables Vegetables cooked in cheese, cream, or butter sauce. Fried vegetables. Grains Breads that are made with saturated or trans fats, oils, or whole milk. Croissants. Sweet rolls. Donuts. High-fat crackers, such as cheese crackers. Meats and other proteins Fatty meats, such as hot dogs, ribs, sausage, bacon, rib-eye roast or steak. High-fat deli meats, such as salami and bologna. Caviar. Domestic duck and goose. Organ meats, such as liver. Dairy Cream, sour cream, cream cheese, and creamed cottage cheese. Whole-milk cheeses. Whole or 2% milk that is liquid, evaporated, or condensed. Whole buttermilk. Cream sauce or high-fat cheese sauce. Yogurt that is made from whole milk. Fats and oils Meat fat, or shortening. Cocoa butter, hydrogenated oils, palm oil, coconut oil, palm kernel oil. Solid fats and shortenings, including bacon fat, salt pork, lard, and butter. Nondairy cream substitutes. Salad dressings with cheese or sour cream. Beverages Regular sodas and juice drinks with added sugar. Sweets and desserts Frosting. Pudding. Cookies. Cakes. Pies. Milk chocolate or white chocolate. Buttered syrups. Full-fat ice cream or ice cream drinks. The items listed above may not be a complete list of foods and drinks to avoid. Contact a dietitian for more information. Summary  Heart-healthy meal planning includes eating less unhealthy fats, eating  more healthy fats, and making other changes in your diet.  Eat a balanced diet. This includes fruits and vegetables, low-fat or nonfat dairy, lean protein, nuts and legumes, whole grains, and heart-healthy oils and fats. This information is not intended to replace advice given to you by your health care provider. Make sure you discuss any questions you have with your health care provider. Document Released: 06/02/2012 Document Revised: 01/09/2018 Document Reviewed: 01/09/2018 Elsevier Interactive Patient Education  2019 [employer].

## 2019-02-09 ENCOUNTER — Encounter: Payer: Self-pay | Admitting: Family Medicine

## 2019-02-09 LAB — CBC WITH DIFFERENTIAL/PLATELET
BASOS: 1 %
Basophils Absolute: 0 10*3/uL (ref 0.0–0.2)
EOS (ABSOLUTE): 0.2 10*3/uL (ref 0.0–0.4)
EOS: 2 %
HEMATOCRIT: 41.7 % (ref 34.0–46.6)
HEMOGLOBIN: 14.5 g/dL (ref 11.1–15.9)
IMMATURE GRANS (ABS): 0.1 10*3/uL (ref 0.0–0.1)
IMMATURE GRANULOCYTES: 1 %
LYMPHS: 33 %
Lymphocytes Absolute: 2.7 10*3/uL (ref 0.7–3.1)
MCH: 29.7 pg (ref 26.6–33.0)
MCHC: 34.8 g/dL (ref 31.5–35.7)
MCV: 86 fL (ref 79–97)
Monocytes Absolute: 0.6 10*3/uL (ref 0.1–0.9)
Monocytes: 8 %
NEUTROS ABS: 4.6 10*3/uL (ref 1.4–7.0)
NEUTROS PCT: 55 %
Platelets: 387 10*3/uL (ref 150–450)
RBC: 4.88 x10E6/uL (ref 3.77–5.28)
RDW: 11.9 % (ref 11.7–15.4)
WBC: 8.1 10*3/uL (ref 3.4–10.8)

## 2019-02-09 LAB — CMP14+EGFR
A/G RATIO: 1.8 (ref 1.2–2.2)
ALBUMIN: 4.6 g/dL (ref 3.8–4.8)
ALT: 22 IU/L (ref 0–32)
AST: 16 IU/L (ref 0–40)
Alkaline Phosphatase: 82 IU/L (ref 39–117)
BILIRUBIN TOTAL: 0.3 mg/dL (ref 0.0–1.2)
BUN / CREAT RATIO: 22 (ref 9–23)
BUN: 20 mg/dL (ref 6–20)
CALCIUM: 10.1 mg/dL (ref 8.7–10.2)
CHLORIDE: 103 mmol/L (ref 96–106)
CO2: 22 mmol/L (ref 20–29)
Creatinine, Ser: 0.91 mg/dL (ref 0.57–1.00)
GFR, EST AFRICAN AMERICAN: 93 mL/min/{1.73_m2} (ref >59)
GFR, EST NON AFRICAN AMERICAN: 80 mL/min/{1.73_m2} (ref >59)
GLOBULIN, TOTAL: 2.6 g/dL (ref 1.5–4.5)
Glucose: 73 mg/dL (ref 65–99)
POTASSIUM: 4.9 mmol/L (ref 3.5–5.2)
Sodium: 140 mmol/L (ref 134–144)
TOTAL PROTEIN: 7.2 g/dL (ref 6.0–8.5)

## 2019-02-09 LAB — LIPID PANEL
CHOL/HDL RATIO: 3.8 ratio (ref 0.0–4.4)
Cholesterol, Total: 197 mg/dL (ref 100–199)
HDL: 52 mg/dL (ref >39)
LDL Calculated: 125 mg/dL — ABNORMAL HIGH (ref 0–99)
Triglycerides: 100 mg/dL (ref 0–149)
VLDL Cholesterol Cal: 20 mg/dL (ref 5–40)

## 2019-02-09 LAB — VITAMIN D 25 HYDROXY (VIT D DEFICIENCY, FRACTURES): VIT D 25 HYDROXY: 25.6 ng/mL — AB (ref 30.0–100.0)

## 2019-02-09 LAB — TSH: TSH: 2.38 u[IU]/mL (ref 0.450–4.500)

## 2019-02-09 LAB — FOLATE: Folate: 4.2 ng/mL (ref >3.0)

## 2019-02-09 LAB — VITAMIN B12: VITAMIN B 12: 414 pg/mL (ref 232–1245)

## 2019-02-11 ENCOUNTER — Encounter: Payer: Self-pay | Admitting: *Deleted

## 2019-02-11 ENCOUNTER — Other Ambulatory Visit: Payer: Self-pay | Admitting: *Deleted

## 2019-02-11 MED ORDER — ERGOCALCIFEROL 1.25 MG (50000 UT) PO CAPS: 50000.0000 [IU] | ORAL_CAPSULE | ORAL | 1 refills | Status: DC

## 2019-02-12 ENCOUNTER — Telehealth: Payer: Self-pay | Admitting: Family Medicine

## 2019-02-12 ENCOUNTER — Other Ambulatory Visit: Payer: Self-pay | Admitting: Family Medicine

## 2019-02-12 MED ORDER — ESOMEPRAZOLE MAGNESIUM 40 MG PO CPDR: 40.0000 mg | DELAYED_RELEASE_CAPSULE | Freq: Every day | ORAL | 5 refills | Status: DC

## 2019-02-12 NOTE — Telephone Encounter (Signed)
Was this to be done at last OV?

## 2019-02-12 NOTE — Telephone Encounter (Signed)
I sent in the requested prescription 

## 2019-05-14 ENCOUNTER — Encounter: Payer: Self-pay | Admitting: Family Medicine

## 2019-05-14 ENCOUNTER — Other Ambulatory Visit: Payer: Self-pay

## 2019-05-14 ENCOUNTER — Ambulatory Visit (INDEPENDENT_AMBULATORY_CARE_PROVIDER_SITE_OTHER): Payer: BC Managed Care – PPO | Admitting: Family Medicine

## 2019-05-14 DIAGNOSIS — K219 Gastro-esophageal reflux disease without esophagitis: Secondary | ICD-10-CM

## 2019-05-14 DIAGNOSIS — M797 Fibromyalgia: Secondary | ICD-10-CM | POA: Diagnosis not present

## 2019-05-14 DIAGNOSIS — R5382 Chronic fatigue, unspecified: Secondary | ICD-10-CM | POA: Diagnosis not present

## 2019-05-14 DIAGNOSIS — F5101 Primary insomnia: Secondary | ICD-10-CM

## 2019-05-14 MED ORDER — TRAZODONE HCL 50 MG PO TABS: ORAL_TABLET | ORAL | 3 refills | Status: DC

## 2019-05-14 MED ORDER — VALACYCLOVIR HCL 500 MG PO TABS: 500.0000 mg | ORAL_TABLET | ORAL | 3 refills | Status: DC

## 2019-05-14 MED ORDER — ESOMEPRAZOLE MAGNESIUM 40 MG PO CPDR: 40.0000 mg | DELAYED_RELEASE_CAPSULE | Freq: Every day | ORAL | 3 refills | Status: DC

## 2019-05-14 NOTE — Progress Notes (Signed)
Subjective:    Patient ID: Erica Nichols, female    DOB: 1981-05-04, 38 y.o.   MRN: [id number]   HPI: Erica Nichols is a 38 y.o. female presenting for decreased sleep quantity. Frequent wakening. Melatonin not helping adequately. Not responding to double dose of melatonin. Staying active. Not having fibromyalgia pains. Has to push herself to be active due to chronic fatigue.  Has lost 10 lb since last office visit using low carb approach with intermittent fasting.    Patient in for follow-up of GERD. Currently asymptomatic taking  PPI daily. There is no chest pain or heartburn. No hematemesis and no melena. No dysphagia or choking. Onset is remote. Progression is stable. Complicating factors, none.  Depression screen [facility] 2/9 02/08/2019 02/03/2019  Decreased Interest 0 0  Down, Depressed, Hopeless 0 0  PHQ - 2 Score 0 0     Relevant past medical, surgical, family and social history reviewed and updated as indicated.  Interim medical history since our last visit reviewed. Allergies and medications reviewed and updated.  ROS:  Review of Systems  Constitutional: Positive for fatigue.  HENT: Negative for congestion.   Eyes: Negative for visual disturbance.  Respiratory: Negative for shortness of breath.   Cardiovascular: Negative for chest pain.  Gastrointestinal: Negative for abdominal pain, constipation, diarrhea, nausea and vomiting.  Genitourinary: Negative for difficulty urinating.  Musculoskeletal: Negative for arthralgias and myalgias.  Neurological: Negative for weakness and headaches.  Psychiatric/Behavioral: Positive for sleep disturbance.     Social History   Tobacco Use  Smoking Status Never Smoker  Smokeless Tobacco Never Used       Objective:     Wt Readings from Last 3 Encounters:  02/08/19 154 lb 4 oz (70 kg)  02/03/19 156 lb 9.6 oz (71 kg)  07/22/16 147 lb (66.7 kg)     Exam deferred. Pt. Harboring due to COVID 19. Phone visit  performed.   Assessment & Plan:   1. Primary insomnia   2. Fibromyalgia   3. Chronic fatigue   4. Gastroesophageal reflux disease without esophagitis     Meds ordered this encounter  Medications  . valACYclovir (VALTREX) 500 MG tablet    Sig: Take 1 tablet (500 mg total) by mouth every other day.    Dispense:  45 tablet    Refill:  3  . traZODone (DESYREL) 50 MG tablet    Sig: Use from 1/3 to 1 tablet nightly as needed for sleep.    Dispense:  90 tablet    Refill:  3  . esomeprazole (NEXIUM) 40 MG capsule    Sig: Take 1 capsule (40 mg total) by mouth daily.    Dispense:  90 capsule    Refill:  3    No orders of the defined types were placed in this encounter.     Diagnoses and all orders for this visit:  Primary insomnia  Fibromyalgia  Chronic fatigue  Gastroesophageal reflux disease without esophagitis  Other orders -     valACYclovir (VALTREX) 500 MG tablet; Take 1 tablet (500 mg total) by mouth every other day. -     traZODone (DESYREL) 50 MG tablet; Use from 1/3 to 1 tablet nightly as needed for sleep. -     esomeprazole (NEXIUM) 40 MG capsule; Take 1 capsule (40 mg total) by mouth daily.    Virtual Visit via telephone Note  I discussed the limitations, risks, security and privacy concerns of performing an evaluation and management service by  telephone and the availability of in person appointments. The patient was identified with two identifiers. Pt.expressed understanding and agreed to proceed. Pt. Is at home. Dr. Darlyn ReadStacks is in his office.  Follow Up Instructions:   I discussed the assessment and treatment plan with the patient. The patient was provided an opportunity to ask questions and all were answered. The patient agreed with the plan and demonstrated an understanding of the instructions.   The patient was advised to call back or seek an in-person evaluation if the symptoms worsen or if the condition fails to improve as anticipated.   Total minutes  including chart review and phone contact time: 25   Follow up plan: Return in about 1 year (around 05/13/2020).  Mechele ClaudeWarren Shariah Assad, MD [facility]

## 2019-09-29 ENCOUNTER — Other Ambulatory Visit: Payer: Self-pay

## 2019-09-30 ENCOUNTER — Ambulatory Visit: Payer: BC Managed Care – PPO | Admitting: Family Medicine

## 2019-10-06 ENCOUNTER — Encounter: Payer: Self-pay | Admitting: Family Medicine

## 2019-10-06 ENCOUNTER — Ambulatory Visit (INDEPENDENT_AMBULATORY_CARE_PROVIDER_SITE_OTHER): Payer: BC Managed Care – PPO | Admitting: Family Medicine

## 2019-10-06 ENCOUNTER — Other Ambulatory Visit: Payer: Self-pay

## 2019-10-06 VITALS — BP 112/74 | HR 83 | Temp 98.9°F | Resp 18 | Ht 63.0 in | Wt 154.0 lb

## 2019-10-06 DIAGNOSIS — F321 Major depressive disorder, single episode, moderate: Secondary | ICD-10-CM | POA: Diagnosis not present

## 2019-10-06 DIAGNOSIS — R5383 Other fatigue: Secondary | ICD-10-CM

## 2019-10-06 MED ORDER — ESCITALOPRAM OXALATE 10 MG PO TABS: 10.0000 mg | ORAL_TABLET | Freq: Every day | ORAL | 1 refills | Status: DC

## 2019-10-06 NOTE — Progress Notes (Signed)
Subjective:  Patient ID: Erica Nichols, female    DOB: [birth year]  Age: 38 y.o. MRN: [id number]  CC: Knots under both arms and Fatigue   HPI Erica Nichols presents for Energy about 1/2 of usual for the last month or two. Recently had surgery and her ex-husband took her kids even though she has custody. Says she feels depressed. She is disinterested in usual activities. CBC, CMP & UA from [facility] do not show any significant abnormality. (Scanned to chart)  Depression screen [facility] 2/9 10/06/2019 02/08/2019 02/03/2019  Decreased Interest 0 0 0  Down, Depressed, Hopeless 0 0 0  PHQ - 2 Score 0 0 0    History Erica Nichols has a past medical history of Acid reflux, ADHD, Allergy, Anxiety, Depression, Fibromyalgia (2012), and Interstitial cystitis.   She has a past surgical history that includes Appendectomy; Cholecystectomy; Abdominal hysterectomy; and ERCP (08/2016).   Her family history includes ADD / ADHD in her brother, daughter, and daughter; Alcohol abuse in her maternal grandfather; Arthritis in her maternal grandmother; Asthma in her daughter; COPD in her maternal grandmother; Cancer in her maternal grandmother; Depression in her mother; Diabetes in her father and maternal grandmother; Drug abuse in her brother and mother; Heart disease in her maternal grandmother; Hyperlipidemia in her maternal grandmother; Hypertension in her maternal grandmother and mother; Learning disabilities in her brother and daughter; Mood Disorder in her daughter.She reports that she has never smoked. She has never used smokeless tobacco. She reports that she does not drink alcohol or use drugs.    ROS Review of Systems  Constitutional: Positive for fatigue. Negative for fever.  HENT: Negative for congestion.   Eyes: Negative for visual disturbance.  Respiratory: Negative for shortness of breath.   Cardiovascular: Negative for chest pain.  Gastrointestinal: Negative for abdominal pain,  constipation, diarrhea, nausea and vomiting.  Genitourinary: Negative for difficulty urinating.  Musculoskeletal: Negative for arthralgias and myalgias.  Neurological: Negative for headaches.  Psychiatric/Behavioral: Negative for sleep disturbance.    Objective:  BP 112/74   Pulse 83   Temp 98.9 F (37.2 C) (Temporal)   Resp 18   Ht 5\' 3"  (1.6 m)   Wt 154 lb (69.9 kg)   SpO2 97%   BMI 27.28 kg/m   BP Readings from Last 3 Encounters:  10/06/19 112/74  02/08/19 105/70  02/03/19 108/74    Wt Readings from Last 3 Encounters:  10/06/19 154 lb (69.9 kg)  02/08/19 154 lb 4 oz (70 kg)  02/03/19 156 lb 9.6 oz (71 kg)     Physical Exam Constitutional:      General: She is not in acute distress.    Appearance: She is well-developed.  HENT:     Head: Normocephalic and atraumatic.  Eyes:     Conjunctiva/sclera: Conjunctivae normal.     Pupils: Pupils are equal, round, and reactive to light.  Neck:     Musculoskeletal: Normal range of motion and neck supple.     Thyroid: No thyromegaly.  Cardiovascular:     Rate and Rhythm: Normal rate and regular rhythm.     Heart sounds: Normal heart sounds. No murmur.  Pulmonary:     Effort: Pulmonary effort is normal. No respiratory distress.     Breath sounds: Normal breath sounds. No wheezing or rales.  Abdominal:     General: Bowel sounds are normal. There is no distension.     Palpations: Abdomen is soft.     Tenderness: There is no abdominal  tenderness.  Musculoskeletal: Normal range of motion.  Lymphadenopathy:     Cervical: No cervical adenopathy.  Skin:    General: Skin is warm and dry.  Neurological:     Mental Status: She is alert and oriented to person, place, and time.  Psychiatric:        Behavior: Behavior normal.        Thought Content: Thought content normal.        Judgment: Judgment normal.       Assessment & Plan:   Aimi was seen today for knots under both arms and fatigue.  Diagnoses and all  orders for this visit:  Fatigue, unspecified type -     TSH + free T4 -     Epstein-Barr virus VCA antibody panel -     Cancel: Novel Coronavirus, NAA ([facility])  Current moderate episode of major depressive disorder without prior episode (HCC)  Other orders -     escitalopram (LEXAPRO) 10 MG tablet; Take 1 tablet (10 mg total) by mouth daily. -     EPSTEIN-BARR VIRUS (EBV) Antibody Profile       I have discontinued Devra C. Kohlenberg's ergocalciferol. I am also having her start on escitalopram. Additionally, I am having her maintain her estradiol, valACYclovir, traZODone, and esomeprazole.  Allergies as of 10/06/2019      Reactions   Sulfa Antibiotics Shortness Of Breath, Rash   Macrobid [nitrofurantoin Monohyd Macro] Nausea And Vomiting   Latex Itching, Rash      Medication List       Accurate as of October 06, 2019 11:59 PM. If you have any questions, ask your nurse or doctor.        STOP taking these medications   ergocalciferol 1.25 MG (50000 UT) capsule Commonly known as: VITAMIN D2 Stopped by: Mechele Claude, MD     TAKE these medications   escitalopram 10 MG tablet Commonly known as: LEXAPRO Take 1 tablet (10 mg total) by mouth daily. Started by: Mechele Claude, MD   esomeprazole 40 MG capsule Commonly known as: NexIUM Take 1 capsule (40 mg total) by mouth daily.   estradiol 0.5 MG tablet Commonly known as: ESTRACE Take by mouth.   traZODone 50 MG tablet Commonly known as: DESYREL Use from 1/3 to 1 tablet nightly as needed for sleep.   valACYclovir 500 MG tablet Commonly known as: VALTREX Take 1 tablet (500 mg total) by mouth every other day.        Follow-up: No follow-ups on file.  Mechele Claude, M.D.

## 2019-10-07 ENCOUNTER — Other Ambulatory Visit: Payer: Self-pay

## 2019-10-07 ENCOUNTER — Telehealth: Payer: Self-pay | Admitting: Family Medicine

## 2019-10-07 DIAGNOSIS — Z20822 Contact with and (suspected) exposure to covid-19: Secondary | ICD-10-CM

## 2019-10-07 LAB — EPSTEIN-BARR VIRUS (EBV) ANTIBODY PROFILE
EBV NA IgG: 71 U/mL — ABNORMAL HIGH (ref 0.0–17.9)
EBV VCA IgG: 161 U/mL — ABNORMAL HIGH (ref 0.0–17.9)
EBV VCA IgM: <36 U/mL (ref 0.0–35.9)

## 2019-10-07 LAB — TSH+FREE T4
Free T4: 1.32 ng/dL (ref 0.82–1.77)
TSH: 0.991 u[IU]/mL (ref 0.450–4.500)

## 2019-10-07 NOTE — Telephone Encounter (Signed)
Please contact the patient quarantine until symptoms are gone for three days and test is negative.

## 2019-10-08 ENCOUNTER — Encounter: Payer: Self-pay | Admitting: *Deleted

## 2019-10-08 NOTE — Telephone Encounter (Signed)
Pt aware - note given for work

## 2019-10-10 ENCOUNTER — Encounter: Payer: Self-pay | Admitting: Family Medicine

## 2019-10-10 LAB — NOVEL CORONAVIRUS, NAA: SARS-CoV-2, NAA: NOT DETECTED

## 2019-10-14 ENCOUNTER — Telehealth: Payer: Self-pay | Admitting: Family Medicine

## 2019-10-14 NOTE — Telephone Encounter (Signed)
Patient states she has been symptom free since Sunday. Letter placed up front for patient to pick up- patient aware.

## 2019-10-14 NOTE — Telephone Encounter (Signed)
Please contact the patient Note okay once she is free of all symptoms for 3 days. Please verify with pt.

## 2019-10-14 NOTE — Telephone Encounter (Signed)
Test in chart on 10/22- please advise and send back to pools if note is approved.

## 2019-10-15 ENCOUNTER — Telehealth: Payer: Self-pay | Admitting: Family Medicine

## 2019-10-15 ENCOUNTER — Encounter: Payer: Self-pay | Admitting: Family Medicine

## 2019-10-15 NOTE — Telephone Encounter (Signed)
Patient had already came by office and picked up new note.

## 2019-12-14 ENCOUNTER — Other Ambulatory Visit: Payer: Self-pay

## 2019-12-14 ENCOUNTER — Ambulatory Visit: Payer: BC Managed Care – PPO | Attending: Internal Medicine

## 2019-12-14 DIAGNOSIS — Z20822 Contact with and (suspected) exposure to covid-19: Secondary | ICD-10-CM

## 2019-12-15 LAB — NOVEL CORONAVIRUS, NAA: SARS-CoV-2, NAA: NOT DETECTED

## 2019-12-21 ENCOUNTER — Ambulatory Visit (INDEPENDENT_AMBULATORY_CARE_PROVIDER_SITE_OTHER): Payer: BC Managed Care – PPO | Admitting: Physician Assistant

## 2019-12-21 ENCOUNTER — Encounter: Payer: Self-pay | Admitting: Physician Assistant

## 2019-12-21 ENCOUNTER — Other Ambulatory Visit: Payer: Self-pay

## 2019-12-21 DIAGNOSIS — J40 Bronchitis, not specified as acute or chronic: Secondary | ICD-10-CM

## 2019-12-21 MED ORDER — HYDROCODONE-HOMATROPINE 5-1.5 MG/5ML PO SYRP: 5.0000 mL | ORAL_SOLUTION | Freq: Three times a day (TID) | ORAL | 0 refills | Status: DC | PRN

## 2019-12-21 MED ORDER — AMOXICILLIN-POT CLAVULANATE 875-125 MG PO TABS: 1.0000 | ORAL_TABLET | Freq: Two times a day (BID) | ORAL | 0 refills | Status: DC

## 2019-12-21 NOTE — Progress Notes (Signed)
Telephone visit  Subjective: [id number] PCP: [name], MD [id number] Erica Nichols is a 39 y.o. female calls for telephone consult today. Patient provides verbal consent for consult held via phone.  Patient is identified with 2 separate identifiers.  At this time the entire area is on COVID-19 social distancing and stay home orders are in place.  Patient is of higher risk and therefore we are performing this by a virtual method.  Location of patient: home Location of provider: [facility] Others present for call: no  Patient with symptoms greater than 10 days, several days of progressing upper respiratory and bronchial symptoms. Initially there was more upper respiratory congestion. This progressed to having significant cough that is productive throughout the day and severe at night. There is occasional wheezing after coughing. Sometimes there is slight dyspnea on exertion. It is productive mucus that is yellow in color. Denies any blood.  She usually gets bronchitis a couple of times a year.   ROS: Per HPI  Allergies  Allergen Reactions  . Sulfa Antibiotics Shortness Of Breath and Rash  . Macrobid [Nitrofurantoin Monohyd Macro] Nausea And Vomiting  . Latex Itching and Rash   Past Medical History:  Diagnosis Date  . Acid reflux   . ADHD   . Allergy   . Anxiety   . Depression   . Fibromyalgia 2012  . Interstitial cystitis     Current Outpatient Medications:  .  amoxicillin-clavulanate (AUGMENTIN) 875-125 MG tablet, Take 1 tablet by mouth 2 (two) times daily., Disp: 20 tablet, Rfl: 0 .  escitalopram (LEXAPRO) 10 MG tablet, Take 1 tablet (10 mg total) by mouth daily., Disp: 90 tablet, Rfl: 1 .  esomeprazole (NEXIUM) 40 MG capsule, Take 1 capsule (40 mg total) by mouth daily., Disp: 90 capsule, Rfl: 3 .  estradiol (ESTRACE) 0.5 MG tablet, Take by mouth., Disp: , Rfl:  .  HYDROcodone-homatropine (HYCODAN) 5-1.5 MG/5ML syrup, Take 5 mLs by mouth every 8 (eight) hours as  needed for cough., Disp: 120 mL, Rfl: 0 .  traZODone (DESYREL) 50 MG tablet, Use from 1/3 to 1 tablet nightly as needed for sleep., Disp: 90 tablet, Rfl: 3 .  valACYclovir (VALTREX) 500 MG tablet, Take 1 tablet (500 mg total) by mouth every other day., Disp: 45 tablet, Rfl: 3  Assessment/ Plan: 39 y.o. female   1. Bronchitis - amoxicillin-clavulanate (AUGMENTIN) 875-125 MG tablet; Take 1 tablet by mouth 2 (two) times daily.  Dispense: 20 tablet; Refill: 0 - HYDROcodone-homatropine (HYCODAN) 5-1.5 MG/5ML syrup; Take 5 mLs by mouth every 8 (eight) hours as needed for cough.  Dispense: 120 mL; Refill: 0   Return if symptoms worsen or fail to improve.  Continue all other maintenance medications as listed above.  Start time: 9:25 AM End time: 9:34 AM  Meds ordered this encounter  Medications  . amoxicillin-clavulanate (AUGMENTIN) 875-125 MG tablet    Sig: Take 1 tablet by mouth 2 (two) times daily.    Dispense:  20 tablet    Refill:  0    Order Specific Question:   Supervising Provider    Answer:   Janora Norlander [[id number]]  . HYDROcodone-homatropine (HYCODAN) 5-1.5 MG/5ML syrup    Sig: Take 5 mLs by mouth every 8 (eight) hours as needed for cough.    Dispense:  120 mL    Refill:  0    Order Specific Question:   Supervising Provider    Answer:   Janora Norlander [[id number]]    [facility]  Darla Lesches [facility] [phone]

## 2019-12-23 ENCOUNTER — Other Ambulatory Visit: Payer: Self-pay

## 2019-12-23 ENCOUNTER — Ambulatory Visit: Payer: BC Managed Care – PPO | Attending: Family

## 2019-12-23 DIAGNOSIS — Z20822 Contact with and (suspected) exposure to covid-19: Secondary | ICD-10-CM

## 2019-12-24 LAB — NOVEL CORONAVIRUS, NAA: SARS-CoV-2, NAA: NOT DETECTED

## 2020-02-04 ENCOUNTER — Other Ambulatory Visit: Payer: Self-pay

## 2020-02-07 ENCOUNTER — Other Ambulatory Visit: Payer: Self-pay

## 2020-02-07 ENCOUNTER — Encounter: Payer: Self-pay | Admitting: Family Medicine

## 2020-02-07 ENCOUNTER — Ambulatory Visit (INDEPENDENT_AMBULATORY_CARE_PROVIDER_SITE_OTHER): Payer: BC Managed Care – PPO | Admitting: Family Medicine

## 2020-02-07 VITALS — BP 117/74 | HR 66 | Temp 99.1°F | Ht 63.0 in | Wt 154.4 lb

## 2020-02-07 DIAGNOSIS — F900 Attention-deficit hyperactivity disorder, predominantly inattentive type: Secondary | ICD-10-CM

## 2020-02-07 DIAGNOSIS — Z0001 Encounter for general adult medical examination with abnormal findings: Secondary | ICD-10-CM

## 2020-02-07 DIAGNOSIS — F321 Major depressive disorder, single episode, moderate: Secondary | ICD-10-CM

## 2020-02-07 DIAGNOSIS — F5101 Primary insomnia: Secondary | ICD-10-CM

## 2020-02-07 DIAGNOSIS — Z0000 Encounter for general adult medical examination without abnormal findings: Secondary | ICD-10-CM

## 2020-02-07 DIAGNOSIS — M722 Plantar fascial fibromatosis: Secondary | ICD-10-CM | POA: Diagnosis not present

## 2020-02-07 DIAGNOSIS — F3341 Major depressive disorder, recurrent, in partial remission: Secondary | ICD-10-CM

## 2020-02-07 DIAGNOSIS — K219 Gastro-esophageal reflux disease without esophagitis: Secondary | ICD-10-CM

## 2020-02-07 LAB — URINALYSIS
Bilirubin, UA: NEGATIVE
Glucose, UA: NEGATIVE
Ketones, UA: NEGATIVE
Leukocytes,UA: NEGATIVE
Nitrite, UA: NEGATIVE
Protein,UA: NEGATIVE
Specific Gravity, UA: 1.025 (ref 1.005–1.030)
Urobilinogen, Ur: 0.2 mg/dL (ref 0.2–1.0)
pH, UA: 6 (ref 5.0–7.5)

## 2020-02-07 MED ORDER — VALACYCLOVIR HCL 500 MG PO TABS: 500.0000 mg | ORAL_TABLET | ORAL | 3 refills | Status: AC

## 2020-02-07 MED ORDER — DICLOFENAC SODIUM 75 MG PO TBEC: 75.0000 mg | DELAYED_RELEASE_TABLET | Freq: Two times a day (BID) | ORAL | 2 refills | Status: DC

## 2020-02-07 MED ORDER — LISDEXAMFETAMINE DIMESYLATE 20 MG PO CAPS: 20.0000 mg | ORAL_CAPSULE | ORAL | 0 refills | Status: DC

## 2020-02-07 MED ORDER — ESOMEPRAZOLE MAGNESIUM 40 MG PO CPDR: 40.0000 mg | DELAYED_RELEASE_CAPSULE | Freq: Every day | ORAL | 3 refills | Status: DC

## 2020-02-07 MED ORDER — TRAZODONE HCL 50 MG PO TABS: ORAL_TABLET | ORAL | 3 refills | Status: DC

## 2020-02-07 MED ORDER — ESCITALOPRAM OXALATE 10 MG PO TABS: 10.0000 mg | ORAL_TABLET | Freq: Every day | ORAL | 1 refills | Status: DC

## 2020-02-07 NOTE — Progress Notes (Signed)
Subjective:  Patient ID: [name], female    DOB: 12-03-1981  Age: 39 y.o. MRN: [id number]  CC: Annual Exam   HPI Erica Nichols presents for Hx ADD. Needs to go back on med as she is starting back to school. She has had adderall  In the past. Requests refill.    Pt. Also having several months of pain in the left heel. Pain is worse with putting pressure on the foot with ambulation. It is worst first thing in the morning.   She is anxious and depressed, but doing well with lexapro.   Patient in for follow-up of GERD. Currently asymptomatic taking  PPI daily. There is no chest pain or heartburn. No hematemesis and no melena. No dysphagia or choking. Onset is remote. Progression is stable. Complicating factors, none. .  Patient is also doing well with her current insomnia medication.   Depression screen [facility] 2/9 02/07/2020 10/06/2019 02/08/2019  Decreased Interest 1 0 0  Down, Depressed, Hopeless 1 0 0  PHQ - 2 Score 2 0 0  Altered sleeping 0 - -  Tired, decreased energy 1 - -  Change in appetite 0 - -  Feeling bad or failure about yourself  1 - -  Trouble concentrating 3 - -  Moving slowly or fidgety/restless 0 - -  Suicidal thoughts 0 - -  PHQ-9 Score 7 - -  Difficult doing work/chores Somewhat difficult - -    History Erica Nichols has a past medical history of Acid reflux, ADHD, Allergy, Anxiety, Depression, Fibromyalgia (2012), and Interstitial cystitis.   She has a past surgical history that includes Appendectomy; Cholecystectomy; Abdominal hysterectomy; and ERCP (08/2016).   Her family history includes ADD / ADHD in her brother, daughter, and daughter; Alcohol abuse in her maternal grandfather; Arthritis in her maternal grandmother; Asthma in her daughter; COPD in her maternal grandmother; Cancer in her maternal grandmother; Depression in her mother; Diabetes in her father and maternal grandmother; Drug abuse in her brother and mother; Heart disease in her  maternal grandmother; Hyperlipidemia in her maternal grandmother; Hypertension in her maternal grandmother and mother; Learning disabilities in her brother and daughter; Mood Disorder in her daughter.She reports that she has never smoked. She has never used smokeless tobacco. She reports that she does not drink alcohol or use drugs.    ROS Review of Systems  Constitutional: Negative for chills, diaphoresis and fever.  HENT: Negative for congestion, ear pain, hearing loss, nosebleeds, sore throat and tinnitus.   Eyes: Negative for photophobia, pain, discharge and redness.  Respiratory: Negative for cough, shortness of breath and wheezing.   Cardiovascular: Negative for chest pain, palpitations and leg swelling.  Gastrointestinal: Negative for abdominal pain, blood in stool, constipation, diarrhea, nausea and vomiting.  Endocrine: Negative for polydipsia.  Genitourinary: Negative for dysuria, flank pain, frequency, hematuria and urgency.  Musculoskeletal: Positive for arthralgias (left foot pain). Negative for back pain, myalgias and neck pain.  Skin: Negative for rash.  Allergic/Immunologic: Negative for environmental allergies.  Neurological: Negative for dizziness, tremors, seizures, weakness and headaches.  Hematological: Does not bruise/bleed easily.  Psychiatric/Behavioral: Positive for decreased concentration and dysphoric mood. Negative for hallucinations and suicidal ideas. The patient is not nervous/anxious.     Objective:  BP 117/74   Pulse 66   Temp 99.1 F (37.3 C) (Temporal)   Ht '5\' 3"'  (1.6 m)   Wt 154 lb 6.4 oz (70 kg)   BMI 27.35 kg/m   BP Readings from Last 3  Encounters:  02/07/20 117/74  10/06/19 112/74  02/08/19 105/70    Wt Readings from Last 3 Encounters:  02/07/20 154 lb 6.4 oz (70 kg)  10/06/19 154 lb (69.9 kg)  02/08/19 154 lb 4 oz (70 kg)     Physical Exam Constitutional:      General: She is not in acute distress.    Appearance: Normal  appearance. She is well-developed.  HENT:     Head: Normocephalic and atraumatic.     Right Ear: External ear normal.     Left Ear: External ear normal.     Nose: Nose normal.  Eyes:     Conjunctiva/sclera: Conjunctivae normal.     Pupils: Pupils are equal, round, and reactive to light.  Neck:     Thyroid: No thyromegaly.  Cardiovascular:     Rate and Rhythm: Normal rate and regular rhythm.     Heart sounds: Normal heart sounds. No murmur.  Pulmonary:     Effort: Pulmonary effort is normal. No respiratory distress.     Breath sounds: Normal breath sounds. No wheezing or rales.  Chest:     Breasts: Breasts are symmetrical.        Right: No inverted nipple, mass or tenderness.        Left: No inverted nipple, mass or tenderness.  Abdominal:     General: Bowel sounds are normal. There is no distension or abdominal bruit.     Palpations: Abdomen is soft. There is no hepatomegaly, splenomegaly or mass.     Tenderness: There is no abdominal tenderness. Negative signs include Murphy's sign and McBurney's sign.  Musculoskeletal:        General: No tenderness. Normal range of motion.     Cervical back: Normal range of motion and neck supple.  Lymphadenopathy:     Cervical: No cervical adenopathy.  Skin:    General: Skin is warm and dry.     Findings: No rash.  Neurological:     Mental Status: She is alert and oriented to person, place, and time.     Deep Tendon Reflexes: Reflexes are normal and symmetric.  Psychiatric:        Behavior: Behavior normal.        Thought Content: Thought content normal.        Judgment: Judgment normal.       Assessment & Plan:   Erica Nichols was seen today for annual exam.  Diagnoses and all orders for this visit:  Well adult exam -     CBC with Differential/Platelet -     CMP14+EGFR -     Lipid panel -     TSH -     Urinalysis  Attention deficit hyperactivity disorder (ADHD), predominantly inattentive type  Plantar fasciitis of left  foot -     diclofenac (VOLTAREN) 75 MG EC tablet; Take 1 tablet (75 mg total) by mouth 2 (two) times daily. For muscle and  Joint pain -     Ambulatory referral to Podiatry  Recurrent major depressive disorder, in partial remission ([place])  Primary insomnia  Gastroesophageal reflux disease without esophagitis  Current moderate episode of major depressive disorder without prior episode (HCC) -     escitalopram (LEXAPRO) 10 MG tablet; Take 1 tablet (10 mg total) by mouth daily.  Other orders -     esomeprazole (NEXIUM) 40 MG capsule; Take 1 capsule (40 mg total) by mouth daily. -     traZODone (DESYREL) 50 MG tablet; Use from 1/3 to  1 tablet nightly as needed for sleep. -     valACYclovir (VALTREX) 500 MG tablet; Take 1 tablet (500 mg total) by mouth every other day. -     lisdexamfetamine (VYVANSE) 20 MG capsule; Take 1 capsule (20 mg total) by mouth every morning.       I have discontinued Mry C. Nase's estradiol, amoxicillin-clavulanate, and HYDROcodone-homatropine. I am also having her start on lisdexamfetamine and diclofenac. Additionally, I am having her maintain her escitalopram, esomeprazole, traZODone, and valACYclovir.  Allergies as of 02/07/2020      Reactions   Sulfa Antibiotics Shortness Of Breath, Rash   Macrobid [nitrofurantoin Monohyd Macro] Nausea And Vomiting   Latex Itching, Rash      Medication List       Accurate as of February 07, 2020 10:28 PM. If you have any questions, ask your nurse or doctor.        STOP taking these medications   amoxicillin-clavulanate 875-125 MG tablet Commonly known as: AUGMENTIN Stopped by: [name], MD   estradiol 0.5 MG tablet Commonly known as: ESTRACE Stopped by: [name], MD   HYDROcodone-homatropine 5-1.5 MG/5ML syrup Commonly known as: HYCODAN Stopped by: [name], MD     TAKE these medications   diclofenac 75 MG EC tablet Commonly known as: VOLTAREN Take 1 tablet (75 mg total) by  mouth 2 (two) times daily. For muscle and  Joint pain Started by: [name], MD   escitalopram 10 MG tablet Commonly known as: LEXAPRO Take 1 tablet (10 mg total) by mouth daily.   esomeprazole 40 MG capsule Commonly known as: NexIUM Take 1 capsule (40 mg total) by mouth daily.   lisdexamfetamine 20 MG capsule Commonly known as: Vyvanse Take 1 capsule (20 mg total) by mouth every morning. Started by: [name], MD   traZODone 50 MG tablet Commonly known as: DESYREL Use from 1/3 to 1 tablet nightly as needed for sleep.   valACYclovir 500 MG tablet Commonly known as: VALTREX Take 1 tablet (500 mg total) by mouth every other day.        Follow-up: No follow-ups on file.  [name], M.D.

## 2020-02-08 ENCOUNTER — Telehealth: Payer: Self-pay | Admitting: *Deleted

## 2020-02-08 LAB — CMP14+EGFR
ALT: 18 IU/L (ref 0–32)
AST: 19 IU/L (ref 0–40)
Albumin/Globulin Ratio: 1.6 (ref 1.2–2.2)
Albumin: 4.1 g/dL (ref 3.8–4.8)
Alkaline Phosphatase: 62 IU/L (ref 39–117)
BUN/Creatinine Ratio: 16 (ref 9–23)
BUN: 15 mg/dL (ref 6–20)
Bilirubin Total: <0.2 mg/dL (ref 0.0–1.2)
CO2: 24 mmol/L (ref 20–29)
Calcium: 9.1 mg/dL (ref 8.7–10.2)
Chloride: 104 mmol/L (ref 96–106)
Creatinine, Ser: 0.95 mg/dL (ref 0.57–1.00)
GFR calc Af Amer: 87 mL/min/{1.73_m2} (ref >59)
GFR calc non Af Amer: 76 mL/min/{1.73_m2} (ref >59)
Globulin, Total: 2.6 g/dL (ref 1.5–4.5)
Glucose: 77 mg/dL (ref 65–99)
Potassium: 4.2 mmol/L (ref 3.5–5.2)
Sodium: 142 mmol/L (ref 134–144)
Total Protein: 6.7 g/dL (ref 6.0–8.5)

## 2020-02-08 LAB — LIPID PANEL
Chol/HDL Ratio: 3.3 ratio (ref 0.0–4.4)
Cholesterol, Total: 187 mg/dL (ref 100–199)
HDL: 56 mg/dL (ref >39)
LDL Chol Calc (NIH): 114 mg/dL — ABNORMAL HIGH (ref 0–99)
Triglycerides: 91 mg/dL (ref 0–149)
VLDL Cholesterol Cal: 17 mg/dL (ref 5–40)

## 2020-02-08 LAB — CBC WITH DIFFERENTIAL/PLATELET
Basophils Absolute: 0 10*3/uL (ref 0.0–0.2)
Basos: 1 %
EOS (ABSOLUTE): 0.1 10*3/uL (ref 0.0–0.4)
Eos: 2 %
Hematocrit: 40 % (ref 34.0–46.6)
Hemoglobin: 13.6 g/dL (ref 11.1–15.9)
Immature Grans (Abs): 0 10*3/uL (ref 0.0–0.1)
Immature Granulocytes: 0 %
Lymphocytes Absolute: 2 10*3/uL (ref 0.7–3.1)
Lymphs: 37 %
MCH: 28.8 pg (ref 26.6–33.0)
MCHC: 34 g/dL (ref 31.5–35.7)
MCV: 85 fL (ref 79–97)
Monocytes Absolute: 0.4 10*3/uL (ref 0.1–0.9)
Monocytes: 8 %
Neutrophils Absolute: 2.9 10*3/uL (ref 1.4–7.0)
Neutrophils: 52 %
Platelets: 311 10*3/uL (ref 150–450)
RBC: 4.72 x10E6/uL (ref 3.77–5.28)
RDW: 12.1 % (ref 11.7–15.4)
WBC: 5.5 10*3/uL (ref 3.4–10.8)

## 2020-02-08 LAB — TSH: TSH: 0.857 u[IU]/mL (ref 0.450–4.500)

## 2020-02-08 NOTE — Telephone Encounter (Addendum)
Prior Auth for Vyvanse 20mg -APPROVED till 02/07/23  Pharmacy notified  Key: 02/09/23 -   PA Case ID: Erica Nichols   Your information has been submitted to [employer]. To check for an updated outcome later, reopen this PA request from your dashboard.  If [employer] has not responded to your request within 24 hours, contact [employer] at [phone]. If you think there may be a problem with your PA request, use our live chat feature at the bottom right.

## 2020-02-08 NOTE — Progress Notes (Signed)
Hello Kahlia,  Your lab result is normal and/or stable.Some minor variations that are not significant are commonly marked abnormal, but do not represent any medical problem for you.  Best regards, Addyson Traub, M.D.

## 2020-03-07 ENCOUNTER — Other Ambulatory Visit: Payer: Self-pay | Admitting: Family Medicine

## 2020-03-07 NOTE — Telephone Encounter (Signed)
Looks like this was given for 1 mo on 02/07/20 at [facility] with [facility].  No follow up plan noted

## 2020-03-08 ENCOUNTER — Other Ambulatory Visit: Payer: Self-pay

## 2020-03-08 ENCOUNTER — Encounter: Payer: Self-pay | Admitting: Family Medicine

## 2020-03-08 ENCOUNTER — Ambulatory Visit (INDEPENDENT_AMBULATORY_CARE_PROVIDER_SITE_OTHER): Payer: BC Managed Care – PPO | Admitting: Family Medicine

## 2020-03-08 VITALS — BP 115/77 | HR 53 | Temp 98.4°F | Ht 63.0 in | Wt 148.6 lb

## 2020-03-08 DIAGNOSIS — Z79899 Other long term (current) drug therapy: Secondary | ICD-10-CM

## 2020-03-08 DIAGNOSIS — F900 Attention-deficit hyperactivity disorder, predominantly inattentive type: Secondary | ICD-10-CM

## 2020-03-08 DIAGNOSIS — F1590 Other stimulant use, unspecified, uncomplicated: Secondary | ICD-10-CM | POA: Diagnosis not present

## 2020-03-08 DIAGNOSIS — Z114 Encounter for screening for human immunodeficiency virus [HIV]: Secondary | ICD-10-CM

## 2020-03-08 MED ORDER — LISDEXAMFETAMINE DIMESYLATE 20 MG PO CAPS: 20.0000 mg | ORAL_CAPSULE | ORAL | 0 refills | Status: DC

## 2020-03-08 NOTE — Progress Notes (Signed)
Subjective:  Patient ID: Erica Nichols, female    DOB: 04-Dec-1981  Age: 39 y.o. MRN: [id number]  CC: Follow-up   HPI Erica Nichols presents for follow-up on her attention disorder diagnosis.  She was started on Vyvanse last month.  She had taken stimulants in the remote past.  Primarily Adderall.  She says this works far better for her.  She is sleeping well.  She denies palpitations change of appetite jitteriness or nervousness.  She is able to focus and complete activities without difficulty.  She is also due today to execute a controlled substances contract and leave a toxassure specimen.  She also states that the Escitalopram has helped tremendously.  Her joint pains have resolved with the use of the diclofenac.  She is sleeping much better as well.  Depression screen [facility] 2/9 03/08/2020 02/07/2020 10/06/2019  Decreased Interest 1 1 0  Down, Depressed, Hopeless 1 1 0  PHQ - 2 Score 2 2 0  Altered sleeping 1 0 -  Tired, decreased energy 1 1 -  Change in appetite 1 0 -  Feeling bad or failure about yourself  1 1 -  Trouble concentrating 1 3 -  Moving slowly or fidgety/restless 1 0 -  Suicidal thoughts 0 0 -  PHQ-9 Score 8 7 -  Difficult doing work/chores Somewhat difficult Somewhat difficult -    History Erica Nichols has a past medical history of Acid reflux, ADHD, Allergy, Anxiety, Depression, Fibromyalgia (2012), and Interstitial cystitis.   She has a past surgical history that includes Appendectomy; Cholecystectomy; Abdominal hysterectomy; and ERCP (08/2016).   Her family history includes ADD / ADHD in her brother, daughter, and daughter; Alcohol abuse in her maternal grandfather; Arthritis in her maternal grandmother; Asthma in her daughter; COPD in her maternal grandmother; Cancer in her maternal grandmother; Depression in her mother; Diabetes in her father and maternal grandmother; Drug abuse in her brother and mother; Heart disease in her maternal grandmother;  Hyperlipidemia in her maternal grandmother; Hypertension in her maternal grandmother and mother; Learning disabilities in her brother and daughter; Mood Disorder in her daughter.She reports that she has never smoked. She has never used smokeless tobacco. She reports that she does not drink alcohol or use drugs.    ROS Review of Systems  Constitutional: Negative.   HENT: Negative for congestion.   Eyes: Negative for visual disturbance.  Respiratory: Negative for shortness of breath.   Cardiovascular: Negative for chest pain.  Gastrointestinal: Negative for abdominal pain, constipation and diarrhea.  Genitourinary: Negative for difficulty urinating.  Musculoskeletal: Negative for arthralgias and myalgias.  Neurological: Negative for headaches.  Psychiatric/Behavioral: Negative for sleep disturbance.    Objective:  BP 115/77   Pulse (!) 53   Temp 98.4 F (36.9 C) (Temporal)   Ht 5\' 3"  (1.6 m)   Wt 148 lb 9.6 oz (67.4 kg)   BMI 26.32 kg/m   BP Readings from Last 3 Encounters:  03/08/20 115/77  02/07/20 117/74  10/06/19 112/74    Wt Readings from Last 3 Encounters:  03/08/20 148 lb 9.6 oz (67.4 kg)  02/07/20 154 lb 6.4 oz (70 kg)  10/06/19 154 lb (69.9 kg)     Physical Exam Constitutional:      General: She is not in acute distress.    Appearance: She is well-developed.  Cardiovascular:     Rate and Rhythm: Normal rate and regular rhythm.  Pulmonary:     Breath sounds: Normal breath sounds.  Skin:  General: Skin is warm and dry.  Neurological:     Mental Status: She is alert and oriented to person, place, and time.       Assessment & Plan:   Erica Nichols was seen today for follow-up.  Diagnoses and all orders for this visit:  Encounter for screening for HIV -     HIV Antibody (routine testing w rflx)  Attention deficit hyperactivity disorder (ADHD), predominantly inattentive type -     ToxASSURE Select 13 (MW), Urine  Other stimulant use, unspecified,  uncomplicated -     ToxASSURE Select 13 (MW), Urine  Controlled substance agreement signed  Other orders -     lisdexamfetamine (VYVANSE) 20 MG capsule; Take 1 capsule (20 mg total) by mouth every morning. -     lisdexamfetamine (VYVANSE) 20 MG capsule; Take 1 capsule (20 mg total) by mouth every morning. -     lisdexamfetamine (VYVANSE) 20 MG capsule; Take 1 capsule (20 mg total) by mouth every morning.       I am having Erica Nichols start on lisdexamfetamine and lisdexamfetamine. I am also having her maintain her escitalopram, esomeprazole, traZODone, valACYclovir, diclofenac, and lisdexamfetamine.  Allergies as of 03/08/2020      Reactions   Sulfa Antibiotics Shortness Of Breath, Rash   Macrobid [nitrofurantoin Monohyd Macro] Nausea And Vomiting   Latex Itching, Rash      Medication List       Accurate as of March 08, 2020 11:11 PM. If you have any questions, ask your nurse or doctor.        diclofenac 75 MG EC tablet Commonly known as: VOLTAREN Take 1 tablet (75 mg total) by mouth 2 (two) times daily. For muscle and  Joint pain   escitalopram 10 MG tablet Commonly known as: LEXAPRO Take 1 tablet (10 mg total) by mouth daily.   esomeprazole 40 MG capsule Commonly known as: NexIUM Take 1 capsule (40 mg total) by mouth daily.   lisdexamfetamine 20 MG capsule Commonly known as: Vyvanse Take 1 capsule (20 mg total) by mouth every morning. What changed: Another medication with the same name was added. Make sure you understand how and when to take each. Changed by: [name], MD   lisdexamfetamine 20 MG capsule Commonly known as: Vyvanse Take 1 capsule (20 mg total) by mouth every morning. Start taking on: April 07, 2020 What changed: You were already taking a medication with the same name, and this prescription was added. Make sure you understand how and when to take each. Changed by: [name], MD   lisdexamfetamine 20 MG capsule Commonly known  as: Vyvanse Take 1 capsule (20 mg total) by mouth every morning. Start taking on: May 07, 2020 What changed: You were already taking a medication with the same name, and this prescription was added. Make sure you understand how and when to take each. Changed by: [name], MD   traZODone 50 MG tablet Commonly known as: DESYREL Use from 1/3 to 1 tablet nightly as needed for sleep.   valACYclovir 500 MG tablet Commonly known as: VALTREX Take 1 tablet (500 mg total) by mouth every other day.      Controlled substance agreement was reviewed in detail with patient and after signing it it was sent to be scanned into her chart.  Toxassure ordered today.  Follow-up: Return in about 3 months (around 06/08/2020).  [name], M.D.

## 2020-03-09 LAB — HIV ANTIBODY (ROUTINE TESTING W REFLEX): HIV Screen 4th Generation wRfx: NONREACTIVE

## 2020-03-11 LAB — TOXASSURE SELECT 13 (MW), URINE

## 2020-06-12 ENCOUNTER — Other Ambulatory Visit: Payer: Self-pay

## 2020-06-12 ENCOUNTER — Ambulatory Visit (INDEPENDENT_AMBULATORY_CARE_PROVIDER_SITE_OTHER): Payer: Commercial Managed Care - PPO | Admitting: Family Medicine

## 2020-06-12 ENCOUNTER — Encounter: Payer: Self-pay | Admitting: Family Medicine

## 2020-06-12 VITALS — BP 107/62 | HR 72 | Temp 97.8°F | Ht 63.0 in | Wt 146.8 lb

## 2020-06-12 DIAGNOSIS — Z9889 Other specified postprocedural states: Secondary | ICD-10-CM | POA: Insufficient documentation

## 2020-06-12 DIAGNOSIS — F900 Attention-deficit hyperactivity disorder, predominantly inattentive type: Secondary | ICD-10-CM | POA: Diagnosis not present

## 2020-06-12 DIAGNOSIS — F321 Major depressive disorder, single episode, moderate: Secondary | ICD-10-CM | POA: Diagnosis not present

## 2020-06-12 MED ORDER — DICLOFENAC SODIUM 75 MG PO TBEC: 75.0000 mg | DELAYED_RELEASE_TABLET | Freq: Two times a day (BID) | ORAL | 2 refills | Status: AC

## 2020-06-12 MED ORDER — LISDEXAMFETAMINE DIMESYLATE 20 MG PO CAPS: 20.0000 mg | ORAL_CAPSULE | ORAL | 0 refills | Status: DC

## 2020-06-12 MED ORDER — ESCITALOPRAM OXALATE 10 MG PO TABS: 10.0000 mg | ORAL_TABLET | Freq: Every day | ORAL | 1 refills | Status: DC

## 2020-06-12 NOTE — Progress Notes (Signed)
Subjective:  Patient ID: Erica Nichols, female    DOB: 1981/04/12  Age: 39 y.o. MRN: [id number]  CC: Follow-up (3 month ADHD)   HPI Erica Nichols presents for recheck of attention deficit disorder.  Her toxassure was reviewed from last time and is essentially normal.  Pain contract is in order.  She denies any symptoms of palpitations chest pain shortness of breath jitteriness irritability loss of sleep and loss of appetite.  Patient is recuperating from a right sided ACL reconstruction.  This was just done a matter of a few days ago.  She is no longer taking pain pills but would like to have her Voltaren refilled.  Depression screen [name] 2/9 06/12/2020 03/08/2020 02/07/2020  Decreased Interest 2 1 1   Down, Depressed, Hopeless 2 1 1   PHQ - 2 Score 4 2 2   Altered sleeping 0 1 0  Tired, decreased energy 0 1 1  Change in appetite 0 1 0  Feeling bad or failure about yourself  0 1 1  Trouble concentrating 0 1 3  Moving slowly or fidgety/restless 0 1 0  Suicidal thoughts 0 0 0  PHQ-9 Score 4 8 7   Difficult doing work/chores Somewhat difficult Somewhat difficult Somewhat difficult    History Erica Nichols has a past medical history of Acid reflux, ADHD, Allergy, Anxiety, Depression, Fibromyalgia (2012), and Interstitial cystitis.   She has a past surgical history that includes Appendectomy; Cholecystectomy; Abdominal hysterectomy; ERCP (08/2016); and Arthroscopic repair ACL.   Her family history includes ADD / ADHD in her brother, daughter, and daughter; Alcohol abuse in her maternal grandfather; Arthritis in her maternal grandmother; Asthma in her daughter; COPD in her maternal grandmother; Cancer in her maternal grandmother; Depression in her mother; Diabetes in her father and maternal grandmother; Drug abuse in her brother and mother; Heart disease in her maternal grandmother; Hyperlipidemia in her maternal grandmother; Hypertension in her maternal grandmother and mother; Learning  disabilities in her brother and daughter; Mood Disorder in her daughter.She reports that she has never smoked. She has never used smokeless tobacco. She reports that she does not drink alcohol and does not use drugs.    ROS Review of Systems  Constitutional: Negative.  Negative for appetite change.  HENT: Negative.   Respiratory: Negative for shortness of breath.   Cardiovascular: Negative for chest pain and palpitations.  Gastrointestinal: Negative for abdominal pain.  Musculoskeletal: Negative for arthralgias.  Psychiatric/Behavioral: Negative for sleep disturbance.    Objective:  BP 107/62   Pulse 72   Temp 97.8 F (36.6 C) (Temporal)   Ht 5\' 3"  (1.6 m)   Wt 146 lb 12.8 oz (66.6 kg)   BMI 26.00 kg/m   BP Readings from Last 3 Encounters:  06/12/20 107/62  03/08/20 115/77  02/07/20 117/74    Wt Readings from Last 3 Encounters:  06/12/20 146 lb 12.8 oz (66.6 kg)  03/08/20 148 lb 9.6 oz (67.4 kg)  02/07/20 154 lb 6.4 oz (70 kg)     Physical Exam Constitutional:      General: She is not in acute distress.    Appearance: She is well-developed.  Cardiovascular:     Rate and Rhythm: Normal rate and regular rhythm.  Pulmonary:     Breath sounds: Normal breath sounds.  Musculoskeletal:     Comments: Patient ambulating on crutches due to recent right knee ACL reconstruction  Skin:    General: Skin is warm and dry.  Neurological:     Mental Status: She is  alert and oriented to person, place, and time.  Psychiatric:        Mood and Affect: Mood normal.       Assessment & Plan:   Erica Nichols was seen today for follow-up.  Diagnoses and all orders for this visit:  Attention deficit hyperactivity disorder (ADHD), predominantly inattentive type -     lisdexamfetamine (VYVANSE) 20 MG capsule; Take 1 capsule (20 mg total) by mouth every morning. -     lisdexamfetamine (VYVANSE) 20 MG capsule; Take 1 capsule (20 mg total) by mouth every morning. -     lisdexamfetamine  (VYVANSE) 20 MG capsule; Take 1 capsule (20 mg total) by mouth every morning.  Current moderate episode of major depressive disorder without prior episode (HCC) -     escitalopram (LEXAPRO) 10 MG tablet; Take 1 tablet (10 mg total) by mouth daily.  S/P ACL repair -     diclofenac (VOLTAREN) 75 MG EC tablet; Take 1 tablet (75 mg total) by mouth 2 (two) times daily. For muscle and  Joint pain       I have discontinued Erica Nichols's ibuprofen. I am also having her maintain her esomeprazole, traZODone, valACYclovir, diclofenac, escitalopram, lisdexamfetamine, lisdexamfetamine, and lisdexamfetamine.  Allergies as of 06/12/2020      Reactions   Sulfa Antibiotics Shortness Of Breath, Rash   Oxycodone-acetaminophen Nausea And Vomiting   Macrobid [nitrofurantoin Monohyd Macro] Nausea And Vomiting   Latex Itching, Rash      Medication List       Accurate as of June 12, 2020  9:08 AM. If you have any questions, ask your nurse or doctor.        STOP taking these medications   ibuprofen 400 MG tablet Commonly known as: ADVIL Stopped by: Mechele Claude, MD     TAKE these medications   diclofenac 75 MG EC tablet Commonly known as: VOLTAREN Take 1 tablet (75 mg total) by mouth 2 (two) times daily. For muscle and  Joint pain   escitalopram 10 MG tablet Commonly known as: LEXAPRO Take 1 tablet (10 mg total) by mouth daily.   esomeprazole 40 MG capsule Commonly known as: NexIUM Take 1 capsule (40 mg total) by mouth daily.   lisdexamfetamine 20 MG capsule Commonly known as: Vyvanse Take 1 capsule (20 mg total) by mouth every morning. What changed: Another medication with the same name was changed. Make sure you understand how and when to take each. Changed by: Mechele Claude, MD   lisdexamfetamine 20 MG capsule Commonly known as: Vyvanse Take 1 capsule (20 mg total) by mouth every morning. Start taking on: July 12, 2020 What changed: These instructions start on July 12, 2020. If you are unsure what to do until then, ask your doctor or other care provider. Changed by: Mechele Claude, MD   lisdexamfetamine 20 MG capsule Commonly known as: Vyvanse Take 1 capsule (20 mg total) by mouth every morning. Start taking on: August 11, 2020 What changed: These instructions start on August 11, 2020. If you are unsure what to do until then, ask your doctor or other care provider. Changed by: Mechele Claude, MD   traZODone 50 MG tablet Commonly known as: DESYREL Use from 1/3 to 1 tablet nightly as needed for sleep.   valACYclovir 500 MG tablet Commonly known as: VALTREX Take 1 tablet (500 mg total) by mouth every other day.        Follow-up: Return in about 3 months (around 09/12/2020).  Mechele Claude, M.D.

## 2020-06-30 ENCOUNTER — Other Ambulatory Visit: Payer: Self-pay | Admitting: Family Medicine

## 2020-06-30 DIAGNOSIS — Z1231 Encounter for screening mammogram for malignant neoplasm of breast: Secondary | ICD-10-CM

## 2020-08-09 ENCOUNTER — Other Ambulatory Visit: Payer: Self-pay | Admitting: Family Medicine

## 2020-08-09 ENCOUNTER — Ambulatory Visit
Admission: RE | Admit: 2020-08-09 | Discharge: 2020-08-09 | Disposition: A | Discharge status: Home / Self Care | Payer: Medicaid Other | Source: Ambulatory Visit | Attending: Family Medicine | Admitting: Family Medicine

## 2020-08-09 ENCOUNTER — Other Ambulatory Visit: Payer: Self-pay

## 2020-08-09 DIAGNOSIS — Z1231 Encounter for screening mammogram for malignant neoplasm of breast: Secondary | ICD-10-CM

## 2020-08-11 ENCOUNTER — Other Ambulatory Visit: Payer: Self-pay | Admitting: Family Medicine

## 2020-08-11 DIAGNOSIS — R928 Other abnormal and inconclusive findings on diagnostic imaging of breast: Secondary | ICD-10-CM

## 2020-08-18 ENCOUNTER — Ambulatory Visit
Admission: RE | Admit: 2020-08-18 | Discharge: 2020-08-18 | Disposition: A | Discharge status: Home / Self Care | Payer: Commercial Managed Care - PPO | Source: Ambulatory Visit | Attending: Family Medicine | Admitting: Family Medicine

## 2020-08-18 ENCOUNTER — Other Ambulatory Visit: Payer: Self-pay | Admitting: Family Medicine

## 2020-08-18 ENCOUNTER — Other Ambulatory Visit: Payer: Self-pay

## 2020-08-18 DIAGNOSIS — R928 Other abnormal and inconclusive findings on diagnostic imaging of breast: Secondary | ICD-10-CM

## 2020-08-18 DIAGNOSIS — R921 Mammographic calcification found on diagnostic imaging of breast: Secondary | ICD-10-CM

## 2020-08-28 ENCOUNTER — Encounter: Payer: Self-pay | Admitting: Family Medicine

## 2020-08-28 ENCOUNTER — Other Ambulatory Visit: Payer: Self-pay

## 2020-08-28 ENCOUNTER — Ambulatory Visit (INDEPENDENT_AMBULATORY_CARE_PROVIDER_SITE_OTHER): Payer: Commercial Managed Care - PPO | Admitting: Family Medicine

## 2020-08-28 VITALS — BP 96/72 | HR 67 | Temp 97.7°F | Ht 63.0 in | Wt 146.0 lb

## 2020-08-28 DIAGNOSIS — R928 Other abnormal and inconclusive findings on diagnostic imaging of breast: Secondary | ICD-10-CM

## 2020-08-28 NOTE — Progress Notes (Signed)
Subjective:  Patient ID: Erica Nichols, female    DOB: [birth year]  Age: 39 y.o. MRN: [id number]  CC: Referral   HPI QUANTASIA STEGNER presents for recent abnormal mammogram and ultrasound.  She was given the option of waiting 6 months for repeat, however she prefers to have the biopsy done now.  Her concern is that she has a strong family history of breast cancer and other cancers.  She is also requesting genetic testing for BRCA and similar cancer genes.  Depression screen [facility] 2/9 08/28/2020 06/12/2020 03/08/2020  Decreased Interest 0 2 1  Down, Depressed, Hopeless 0 2 1  PHQ - 2 Score 0 4 2  Altered sleeping - 0 1  Tired, decreased energy - 0 1  Change in appetite - 0 1  Feeling bad or failure about yourself  - 0 1  Trouble concentrating - 0 1  Moving slowly or fidgety/restless - 0 1  Suicidal thoughts - 0 0  PHQ-9 Score - 4 8  Difficult doing work/chores - Somewhat difficult Somewhat difficult    History Aashika has a past medical history of Acid reflux, ADHD, Allergy, Anxiety, Depression, Fibromyalgia (2012), and Interstitial cystitis.   She has a past surgical history that includes Appendectomy; Cholecystectomy; Abdominal hysterectomy; ERCP (08/2016); Arthroscopic repair ACL; and Augmentation mammaplasty.   Her family history includes ADD / ADHD in her brother, daughter, and daughter; Alcohol abuse in her maternal grandfather; Arthritis in her maternal grandmother; Asthma in her daughter; COPD in her maternal grandmother; Cancer in her maternal grandmother; Depression in her mother; Diabetes in her father and maternal grandmother; Drug abuse in her brother and mother; Heart disease in her maternal grandmother; Hyperlipidemia in her maternal grandmother; Hypertension in her maternal grandmother and mother; Learning disabilities in her brother and daughter; Mood Disorder in her daughter.She reports that she has never smoked. She has never used smokeless tobacco. She reports  that she does not drink alcohol and does not use drugs.    ROS Review of Systems  Objective:  BP 96/72   Pulse 67   Temp 97.7 F (36.5 C) (Temporal)   Ht '5\' 3"'  (1.6 m)   Wt 146 lb (66.2 kg)   BMI 25.86 kg/m   BP Readings from Last 3 Encounters:  08/28/20 96/72  06/12/20 107/62  03/08/20 115/77    Wt Readings from Last 3 Encounters:  08/28/20 146 lb (66.2 kg)  06/12/20 146 lb 12.8 oz (66.6 kg)  03/08/20 148 lb 9.6 oz (67.4 kg)     Physical Exam Constitutional:      General: She is not in acute distress.    Appearance: She is well-developed.  Cardiovascular:     Rate and Rhythm: Normal rate and regular rhythm.  Pulmonary:     Breath sounds: Normal breath sounds.  Skin:    General: Skin is warm and dry.  Neurological:     Mental Status: She is alert and oriented to person, place, and time.       Assessment & Plan:   Eldonna was seen today for referral.  Diagnoses and all orders for this visit:  Abnormal findings on diagnostic imaging of breast -     Ambulatory referral to Interventional Radiology -     Ambulatory referral to Genetics       I am having Linard Millers maintain her esomeprazole, traZODone, valACYclovir, diclofenac, escitalopram, lisdexamfetamine, lisdexamfetamine, lisdexamfetamine, and estradiol.  Allergies as of 08/28/2020      Reactions  Latex Itching, Rash   Sulfa Antibiotics Shortness Of Breath, Rash   Sulfamethoxazole-trimethoprim Hives, Palpitations, Rash, Shortness Of Breath, Swelling   Other reaction(s): Redness   Oxycodone-acetaminophen Nausea And Vomiting   Macrobid [nitrofurantoin Monohyd Macro] Nausea And Vomiting      Medication List       Accurate as of August 28, 2020  1:16 PM. If you have any questions, ask your nurse or doctor.        diclofenac 75 MG EC tablet Commonly known as: VOLTAREN Take 1 tablet (75 mg total) by mouth 2 (two) times daily. For muscle and  Joint pain   escitalopram 10 MG  tablet Commonly known as: LEXAPRO Take 1 tablet (10 mg total) by mouth daily.   esomeprazole 40 MG capsule Commonly known as: NexIUM Take 1 capsule (40 mg total) by mouth daily.   estradiol 0.5 MG tablet Commonly known as: ESTRACE Take by mouth.   lisdexamfetamine 20 MG capsule Commonly known as: Vyvanse Take 1 capsule (20 mg total) by mouth every morning.   lisdexamfetamine 20 MG capsule Commonly known as: Vyvanse Take 1 capsule (20 mg total) by mouth every morning.   lisdexamfetamine 20 MG capsule Commonly known as: Vyvanse Take 1 capsule (20 mg total) by mouth every morning.   traZODone 50 MG tablet Commonly known as: DESYREL Use from 1/3 to 1 tablet nightly as needed for sleep.   valACYclovir 500 MG tablet Commonly known as: VALTREX Take 1 tablet (500 mg total) by mouth every other day.        Follow-up: Return if symptoms worsen or fail to improve.  [name], M.D.

## 2020-08-31 ENCOUNTER — Other Ambulatory Visit: Payer: Self-pay | Admitting: Family Medicine

## 2020-08-31 DIAGNOSIS — R921 Mammographic calcification found on diagnostic imaging of breast: Secondary | ICD-10-CM

## 2020-09-05 ENCOUNTER — Telehealth: Payer: Self-pay | Admitting: Genetic Counselor

## 2020-09-05 NOTE — Telephone Encounter (Signed)
Received a genetic counseling referral from Dr. Livia Snellen for brca. Pt has been cld and scheduled to see Cari on 10/14 at 10am. Pt aware to arrive 20 minutes early.

## 2020-09-07 ENCOUNTER — Ambulatory Visit
Admission: RE | Admit: 2020-09-07 | Discharge: 2020-09-07 | Disposition: A | Discharge status: Home / Self Care | Payer: Commercial Managed Care - PPO | Source: Ambulatory Visit | Attending: Family Medicine | Admitting: Family Medicine

## 2020-09-07 ENCOUNTER — Other Ambulatory Visit: Payer: Self-pay

## 2020-09-07 DIAGNOSIS — R921 Mammographic calcification found on diagnostic imaging of breast: Secondary | ICD-10-CM

## 2020-09-12 ENCOUNTER — Ambulatory Visit: Payer: Commercial Managed Care - PPO | Admitting: Family Medicine

## 2020-09-12 ENCOUNTER — Other Ambulatory Visit: Payer: Self-pay

## 2020-09-12 ENCOUNTER — Encounter: Payer: Self-pay | Admitting: Family Medicine

## 2020-09-12 VITALS — BP 117/66 | HR 65 | Temp 97.3°F | Resp 20 | Ht 63.0 in | Wt 146.0 lb

## 2020-09-12 DIAGNOSIS — K219 Gastro-esophageal reflux disease without esophagitis: Secondary | ICD-10-CM | POA: Diagnosis not present

## 2020-09-12 DIAGNOSIS — M797 Fibromyalgia: Secondary | ICD-10-CM | POA: Diagnosis not present

## 2020-09-12 DIAGNOSIS — Z23 Encounter for immunization: Secondary | ICD-10-CM | POA: Diagnosis not present

## 2020-09-12 DIAGNOSIS — F900 Attention-deficit hyperactivity disorder, predominantly inattentive type: Secondary | ICD-10-CM

## 2020-09-12 MED ORDER — LISDEXAMFETAMINE DIMESYLATE 20 MG PO CAPS: 20.0000 mg | ORAL_CAPSULE | ORAL | 0 refills | Status: AC

## 2020-09-12 MED ORDER — ESOMEPRAZOLE MAGNESIUM 40 MG PO CPDR
40.0000 mg | DELAYED_RELEASE_CAPSULE | Freq: Every day | ORAL | 3 refills | Status: DC
Start: 2020-09-12 — End: 2021-06-27

## 2020-09-12 NOTE — Progress Notes (Signed)
Subjective:  Patient ID: Erica Nichols, female    DOB: [birth year]  Age: 39 y.o. MRN: [id number]  CC: Medical Management of Chronic Issues   HPI Erica Nichols presents for follow up of ADD. Focusing well. Denies side effects of the Vyvanse.  She is eating and sleeping well.  Work is going much better..   Breast bx. Report reviewed with pt. it showed fibrocystic disease and no sign of cancer.  Although the biopsy site is still sore and was painful at first she is greatly relieved.  Going for genetic testing on 10/14.  Patient in for follow-up of GERD. Currently asymptomatic taking  PPI daily. There is no chest pain or heartburn. No hematemesis and no melena. No dysphagia or choking. Onset is remote. Progression is stable. Complicating factors, none. She is taking diclofenac intermittently as needed for muscle and joint pains.  She is also taking Lexapro.  Reflux symptoms do not seem to be exacerbated at this time by that combination.  Depression screen [facility] 2/9 09/12/2020 08/28/2020 06/12/2020  Decreased Interest 0 0 2  Down, Depressed, Hopeless 0 0 2  PHQ - 2 Score 0 0 4  Altered sleeping - - 0  Tired, decreased energy - - 0  Change in appetite - - 0  Feeling bad or failure about yourself  - - 0  Trouble concentrating - - 0  Moving slowly or fidgety/restless - - 0  Suicidal thoughts - - 0  PHQ-9 Score - - 4  Difficult doing work/chores - - Somewhat difficult    History Erica Nichols has a past medical history of Acid reflux, ADHD, Allergy, Anxiety, Depression, Fibromyalgia (2012), and Interstitial cystitis.   She has a past surgical history that includes Appendectomy; Cholecystectomy; Abdominal hysterectomy; ERCP (08/2016); Arthroscopic repair ACL; and Augmentation mammaplasty.   Her family history includes ADD / ADHD in her brother, daughter, and daughter; Alcohol abuse in her maternal grandfather; Arthritis in her maternal grandmother; Asthma in her daughter; COPD in her  maternal grandmother; Cancer in her maternal grandmother; Depression in her mother; Diabetes in her father and maternal grandmother; Drug abuse in her brother and mother; Heart disease in her maternal grandmother; Hyperlipidemia in her maternal grandmother; Hypertension in her maternal grandmother and mother; Learning disabilities in her brother and daughter; Mood Disorder in her daughter.She reports that she has never smoked. She has never used smokeless tobacco. She reports that she does not drink alcohol and does not use drugs.    ROS Review of Systems  Constitutional: Negative.   HENT: Negative.   Eyes: Negative for visual disturbance.  Respiratory: Negative for shortness of breath.   Cardiovascular: Negative for chest pain.  Gastrointestinal: Negative for abdominal pain.  Musculoskeletal: Positive for arthralgias.    Objective:  BP 117/66   Pulse 65   Temp (!) 97.3 F (36.3 C) (Temporal)   Resp 20   Ht 5\' 3"  (1.6 m)   Wt 146 lb (66.2 kg)   BMI 25.86 kg/m   BP Readings from Last 3 Encounters:  09/12/20 117/66  08/28/20 96/72  06/12/20 107/62    Wt Readings from Last 3 Encounters:  09/12/20 146 lb (66.2 kg)  08/28/20 146 lb (66.2 kg)  06/12/20 146 lb 12.8 oz (66.6 kg)     Physical Exam Constitutional:      General: She is not in acute distress.    Appearance: She is well-developed.  Cardiovascular:     Rate and Rhythm: Normal rate and regular rhythm.  Pulmonary:     Breath sounds: Normal breath sounds.  Skin:    General: Skin is warm and dry.  Neurological:     Mental Status: She is alert and oriented to person, place, and time.       Assessment & Plan:   Erica Nichols was seen today for medical management of chronic issues.  Diagnoses and all orders for this visit:  Attention deficit hyperactivity disorder (ADHD), predominantly inattentive type -     lisdexamfetamine (VYVANSE) 20 MG capsule; Take 1 capsule (20 mg total) by mouth every morning. -      lisdexamfetamine (VYVANSE) 20 MG capsule; Take 1 capsule (20 mg total) by mouth every morning. -     lisdexamfetamine (VYVANSE) 20 MG capsule; Take 1 capsule (20 mg total) by mouth every morning.  Need for immunization against influenza -     Flu Vaccine QUAD 36+ mos IM  Fibromyalgia  Gastroesophageal reflux disease without esophagitis  Other orders -     esomeprazole (NEXIUM) 40 MG capsule; Take 1 capsule (40 mg total) by mouth daily.       I am having Erica Nichols maintain her traZODone, valACYclovir, diclofenac, escitalopram, estradiol, lisdexamfetamine, lisdexamfetamine, lisdexamfetamine, and esomeprazole.  Allergies as of 09/12/2020      Reactions   Latex Itching, Rash   Sulfa Antibiotics Shortness Of Breath, Rash   Sulfamethoxazole-trimethoprim Hives, Palpitations, Rash, Shortness Of Breath, Swelling   Other reaction(s): Redness   Oxycodone-acetaminophen Nausea And Vomiting   Macrobid [nitrofurantoin Monohyd Macro] Nausea And Vomiting      Medication List       Accurate as of September 12, 2020  9:40 PM. If you have any questions, ask your nurse or doctor.        diclofenac 75 MG EC tablet Commonly known as: VOLTAREN Take 1 tablet (75 mg total) by mouth 2 (two) times daily. For muscle and  Joint pain What changed:   when to take this  reasons to take this   escitalopram 10 MG tablet Commonly known as: LEXAPRO Take 1 tablet (10 mg total) by mouth daily.   esomeprazole 40 MG capsule Commonly known as: NexIUM Take 1 capsule (40 mg total) by mouth daily.   estradiol 0.5 MG tablet Commonly known as: ESTRACE Take by mouth.   lisdexamfetamine 20 MG capsule Commonly known as: Vyvanse Take 1 capsule (20 mg total) by mouth every morning. What changed: Another medication with the same name was changed. Make sure you understand how and when to take each. Changed by: Mechele Claude, MD   lisdexamfetamine 20 MG capsule Commonly known as: Vyvanse Take  1 capsule (20 mg total) by mouth every morning. Start taking on: October 12, 2020 What changed: These instructions start on October 12, 2020. If you are unsure what to do until then, ask your doctor or other care provider. Changed by: Mechele Claude, MD   lisdexamfetamine 20 MG capsule Commonly known as: Vyvanse Take 1 capsule (20 mg total) by mouth every morning. Start taking on: November 11, 2020 What changed: These instructions start on November 11, 2020. If you are unsure what to do until then, ask your doctor or other care provider. Changed by: Mechele Claude, MD   traZODone 50 MG tablet Commonly known as: DESYREL Use from 1/3 to 1 tablet nightly as needed for sleep.   valACYclovir 500 MG tablet Commonly known as: VALTREX Take 1 tablet (500 mg total) by mouth every other day.  Follow-up: Return in about 3 months (around 12/12/2020).  Mechele Claude, M.D.

## 2020-09-28 ENCOUNTER — Other Ambulatory Visit: Payer: Self-pay

## 2020-09-28 ENCOUNTER — Encounter: Payer: Self-pay | Admitting: Genetic Counselor

## 2020-09-28 ENCOUNTER — Inpatient Hospital Stay: Payer: Commercial Managed Care - PPO

## 2020-09-28 ENCOUNTER — Inpatient Hospital Stay: Payer: Commercial Managed Care - PPO | Attending: Genetic Counselor | Admitting: Genetic Counselor

## 2020-09-28 DIAGNOSIS — Z803 Family history of malignant neoplasm of breast: Secondary | ICD-10-CM | POA: Diagnosis not present

## 2020-09-28 DIAGNOSIS — Z800 Family history of malignant neoplasm of digestive organs: Secondary | ICD-10-CM | POA: Diagnosis not present

## 2020-09-28 DIAGNOSIS — Z801 Family history of malignant neoplasm of trachea, bronchus and lung: Secondary | ICD-10-CM | POA: Diagnosis not present

## 2020-09-28 DIAGNOSIS — Z807 Family history of other malignant neoplasms of lymphoid, hematopoietic and related tissues: Secondary | ICD-10-CM

## 2020-09-28 HISTORY — DX: Family history of malignant neoplasm of trachea, bronchus and lung: Z80.1

## 2020-09-28 HISTORY — DX: Family history of malignant neoplasm of breast: Z80.3

## 2020-09-28 HISTORY — DX: Family history of malignant neoplasm of digestive organs: Z80.0

## 2020-09-28 HISTORY — DX: Family history of other malignant neoplasms of lymphoid, hematopoietic and related tissues: Z80.7

## 2020-09-28 NOTE — Progress Notes (Signed)
EFERRING PROVIDER: [name], MD Erica Nichols,  [place] [address]  PRIMARY PROVIDER:  [name], MD  PRIMARY REASON FOR VISIT:  1. Family history of breast cancer   2. Family history of pancreatic cancer   3. Family history of multiple myeloma   4. Family history of lung cancer    HISTORY OF PRESENT ILLNESS:   Erica Nichols, a 39 y.o. female, was seen for a [facility] cancer genetics consultation at the request of Dr. Livia Snellen due to a family history of cancer.  Erica Nichols presents to clinic today to discuss the possibility of a hereditary predisposition to cancer, to discuss genetic testing, and to further clarify her future cancer risks, as well as potential cancer risks for family members.    Erica Nichols is a 39 y.o. female with no personal history of cancer.  Erica Nichols reports precancerous cells of the ovaries and cervix in her 22s and had a hysterectomy and oophorectomy.    RISK FACTORS:  Menarche was at age 46.  First live birth at age 43.  OCP use for approximately 0 years.  Ovaries intact: no.  Hysterectomy: yes at age 70 due to precancerous cells per patient  Menopausal status: periods stopped at age 30 due to history noted above.  HRT use: 13 years. Colonoscopy: at age 78 due to abnormal bleeding per patient Mammogram within the last year: yes. Number of breast biopsies: 1 in 08/2020; fibrocystic change with microcalcifications; no malignancy identified   Up to date with pelvic exams: no longer receiving pelvic exams. Any excessive radiation exposure in the past: no  Past Medical History:  Diagnosis Date  . Acid reflux   . ADHD   . Allergy   . Anxiety   . Depression   . Family history of breast cancer 09/28/2020  . Family history of lung cancer 09/28/2020  . Family history of multiple myeloma 09/28/2020  . Family history of pancreatic cancer 09/28/2020  . Fibromyalgia 2012  . Interstitial cystitis     Past Surgical History:  Procedure  Laterality Date  . ABDOMINAL HYSTERECTOMY    . APPENDECTOMY    . ARTHROSCOPIC REPAIR ACL    . AUGMENTATION MAMMAPLASTY    . CHOLECYSTECTOMY    . ERCP  08/2016    Social History   Socioeconomic History  . Marital status: Legally Separated    Spouse name: Not on file  . Number of children: Not on file  . Years of education: Not on file  . Highest education level: Not on file  Occupational History  . Not on file  Tobacco Use  . Smoking status: Never Smoker  . Smokeless tobacco: Never Used  Vaping Use  . Vaping Use: Former  . Start date: 09/09/2014  . Quit date: 02/08/2018  Substance and Sexual Activity  . Alcohol use: No  . Drug use: No  . Sexual activity: Yes    Birth control/protection: Surgical  Other Topics Concern  . Not on file  Social History Narrative   ** Merged History Encounter **       Social Determinants of Health   Financial Resource Strain:   . Difficulty of Paying Living Expenses: Not on file  Food Insecurity:   . Worried About Charity fundraiser in the Last Year: Not on file  . Ran Out of Food in the Last Year: Not on file  Transportation Needs:   . Lack of Transportation (Medical): Not on file  . Lack of Transportation (  Non-Medical): Not on file  Physical Activity:   . Days of Exercise per Week: Not on file  . Minutes of Exercise per Session: Not on file  Stress:   . Feeling of Stress : Not on file  Social Connections:   . Frequency of Communication with Friends and Family: Not on file  . Frequency of Social Gatherings with Friends and Family: Not on file  . Attends Religious Services: Not on file  . Active Member of Clubs or Organizations: Not on file  . Attends Archivist Meetings: Not on file  . Marital Status: Not on file     FAMILY HISTORY:  We obtained a detailed, 4-generation family history.  Significant diagnoses are listed below: Family History  Problem Relation Age of Onset  . Other Mother        Breast lesion.  Possible cancer? dx 35  . Lung cancer Maternal Uncle 48  . Breast cancer Other        MGM's sister; dx 45s  . Pancreatic cancer Other        MGM's brother; dx 35s  . Lung cancer Other        MGM's brother; dx 93s  . Brain cancer Other        MGM's brother; dx 46s  . Breast cancer Other        MGM's niece; dx 70s     Erica Nichols has two daughters, ages 7 and 69, without a history of cancer.  Erica Nichols mother is 74.  Erica Nichols reports that her mother had a breast lesion at age 29 and reports that it was possibly cancer.  Erica Nichols has limited information about this lesion and her mother is unavailable for testing. Erica Nichols maternal aunt was diagnosed with lung cancer at age 63.  Erica Nichols's maternal grandmother was diagnosed with multiple myeloma at age 22 and passed away at age 68.  [place] Erica Nichols's maternal grandmother's siblings were diagnosed with cancer--maternal great aunt diagnosed with breast cancer in her 16s, maternal great uncle diagnosed with lung cancer in his 107s, paternal great uncle diagnosed with pancreatic cancer in his 28s, and maternal great uncle diagnosed with brain cancer in his 68s.  Erica Nichols. Dittrich also has a distant cousin diagnosed with breast cancer in her 9s who passed away in her 33s.  Erica Nichols. Lemanski reported uncertain paternity and thus has limited information about her paternal family history.   Erica Nichols is unaware of previous family history of genetic testing for hereditary cancer risks. Patient's maternal ancestors are of Native American and White/Caucasian descent, and paternal ancestors are of White/Caucasian descent. There is no reported Ashkenazi Jewish ancestry. There is no known consanguinity.  GENETIC COUNSELING ASSESSMENT: Erica Nichols is a 39 y.o. female with a family history of breast and pancreatic cancer which is somewhat suggestive of a hereditary cancer syndrome, such as Hereditary Breast and Ovarian Cancer  Syndrome, and predisposition to cancer given related diagnoses in multiple generations. We, therefore, discussed and recommended the following at today's visit.   DISCUSSION: We discussed that 5 - 10% of cancer is hereditary, with most cases of hereditary breast cancer associated with mutations in BRCA1 and BRCA2.  There are other genes that can be associated with hereditary breast cancer syndromes.  Type of cancer risk and level of cancer risk are gene-specific.  We discussed that testing is beneficial for several reasons, including knowing about other cancer risks, identifying potential screening and risk-reduction options that may be appropriate, and  to understanding if other family members could be at risk for cancer and allowing them to undergo genetic testing.  We reviewed the characteristics, features and inheritance patterns of hereditary cancer syndromes. We also discussed genetic testing, including the appropriate family members to test, the process of testing, insurance coverage and turn-around-time for results. We discussed the implications of a negative, positive, carrier and/or variant of uncertain significant result. We discussed that negative results would be uninformative given that Erica Nichols does not have a personal history of cancer. We recommended Erica Nichols pursue genetic testing for a panel that contains genes associated with breast and pancreatic cancer.  Erica Nichols was offered a common hereditary cancer panel (48 genes) and an expanded pan-cancer panel (85 genes). Erica Nichols was informed of the benefits and limitations of each panel, including that expanded pan-cancer panels contain several preliminary evidence genes that do not have clear management guidelines at this point in time.  We also discussed that as the number of genes included on a panel increases, the chances of variants of uncertain significance increases.  After considering the benefits and  limitations of each gene panel, Erica Nichols elected to have an expanded pan-cancer panel through [facility].  The Multi-Cancer Panel offered by [facility] includes sequencing and/or deletion duplication testing of the following 85 genes: AIP, ALK, APC, ATM, AXIN2,BAP1,  BARD1, BLM, BMPR1A, BRCA1, BRCA2, BRIP1, CASR, CDC73, CDH1, CDK4, CDKN1B, CDKN1C, CDKN2A (p14ARF), CDKN2A (p16INK4a), CEBPA, CHEK2, CTNNA1, DICER1, DIS3L2, EGFR (c.2369C>T, p.Thr790Met variant only), EPCAM (Deletion/duplication testing only), FH, FLCN, GATA2, GPC3, GREM1 (Promoter region deletion/duplication testing only), HOXB13 (c.251G>A, p.Gly84Glu), HRAS, KIT, MAX, MEN1, MET, MITF (c.952G>A, p.Glu318Lys variant only), MLH1, MSH2, MSH3, MSH6, MUTYH, NBN, NF1, NF2, NTHL1, PALB2, PDGFRA, PHOX2B, PMS2, POLD1, POLE, POT1, PRKAR1A, PTCH1, PTEN, RAD50, RAD51C, RAD51D, RB1, RECQL4, RET, RNF43, RUNX1, SDHAF2, SDHA (sequence changes only), SDHB, SDHC, SDHD, SMAD4, SMARCA4, SMARCB1, SMARCE1, STK11, SUFU, TERC, TERT, TMEM127, TP53, TSC1, TSC2, VHL, WRN and WT1.   Based on Erica Nichols's family history of breast and pancreatic cancer, Erica Nichols meets medical criteria for genetic testing. Despite that Erica Nichols meets criteria, Erica Nichols may still have an out of pocket cost. We discussed that if her out of pocket cost for testing is over $100, the laboratory will call and confirm whether Erica Nichols wants to proceed with testing.  If the out of pocket cost of testing is less than $100 Erica Nichols will be billed by the genetic testing laboratory.   We discussed that some people do not want to undergo genetic testing due to fear of genetic discrimination.  A federal law called the Genetic Information Non-Discrimination Act (GINA) of 2008 helps protect individuals against genetic discrimination based on their genetic test results.  It impacts both health insurance and employment.  With health insurance, it protects against increased premiums, being kicked off insurance or being forced to  take a test in order to be insured.  For employment it protects against hiring, firing and promoting decisions based on genetic test results.  GINA does not apply to those in the [employer], those who work for companies with less than 15 employees, and new life insurance or long-term disability insurance policies.  Health status due to a cancer diagnosis is not protected under GINA.  PLAN: After considering the risks, benefits, and limitations, Erica Nichols. Goodgame provided informed consent to pursue genetic testing and the blood sample was sent to [facility] for analysis of the Multi-Cancer. Results should be available within approximately 3 weeks' time, at which point they  will be disclosed by telephone to Erica Nichols, as will any additional recommendations warranted by these results. Erica Nichols. Treichler will receive a summary of her genetic counseling visit and a copy of her results once available. This information will also be available in Epic.   Based on Erica Nichols. Dorsch's family history, we recommended her mother, who was diagnosed with possible breast cancer in her 43s. Erica Nichols. Shambaugh stated that Erica Nichols is currently not available for testing but will let us know if we can be of any assistance in coordinating genetic counseling and/or testing for this family member in the future.   Lastly, we encouraged Erica Nichols. Konen to remain in contact with cancer genetics annually so that we can continuously update the family history and inform her of any changes in cancer genetics and testing that may be of benefit for this family.   Erica Nichols. Welsch questions were answered to her satisfaction today. Our contact information was provided should additional questions or concerns arise. Thank you for the referral and allowing [place] to share in the care of your patient.   Valma Rotenberg M. Joette Catching, [place], [facility] Certified Film/video [email or web address] (P) [phone]   The patient was seen for a total of 30  minutes in face-to-face genetic counseling.  This patient was discussed with Drs. Magrinat, Lindi Adie and/or Burr Medico who agrees with the above.    _______________________________________________________________________ For Office Staff:  Number of people involved in session: 1 Was an Intern/ student involved with case: no

## 2020-10-09 ENCOUNTER — Telehealth: Payer: Self-pay | Admitting: Genetic Counselor

## 2020-10-09 NOTE — Telephone Encounter (Signed)
Contacted patient in attempt to disclose results of genetic testing.  LVM with contact information requesting a call back.  

## 2020-10-09 NOTE — Telephone Encounter (Signed)
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  Discussed that we do not know why why there is cancer in the family. It could be sporadic, due to a change she did not inherit, or due to a different gene that we are not testing or maybe our current technology may not be able to pick something up.  It will be important for her to keep in contact with genetics to keep up with whether additional testing may be needed.

## 2020-10-31 ENCOUNTER — Encounter: Payer: Self-pay | Admitting: Genetic Counselor

## 2020-10-31 ENCOUNTER — Ambulatory Visit: Payer: Self-pay | Admitting: Genetic Counselor

## 2020-10-31 DIAGNOSIS — Z801 Family history of malignant neoplasm of trachea, bronchus and lung: Secondary | ICD-10-CM

## 2020-10-31 DIAGNOSIS — Z1379 Encounter for other screening for genetic and chromosomal anomalies: Secondary | ICD-10-CM | POA: Insufficient documentation

## 2020-10-31 DIAGNOSIS — Z807 Family history of other malignant neoplasms of lymphoid, hematopoietic and related tissues: Secondary | ICD-10-CM

## 2020-10-31 DIAGNOSIS — Z803 Family history of malignant neoplasm of breast: Secondary | ICD-10-CM

## 2020-10-31 DIAGNOSIS — Z800 Family history of malignant neoplasm of digestive organs: Secondary | ICD-10-CM

## 2020-10-31 NOTE — Progress Notes (Signed)
HPI:  [name] was previously seen in the [facility] clinic due to a family history of cancer and concerns regarding a hereditary predisposition to cancer. Please refer to our prior cancer genetics clinic note for more information regarding our discussion, assessment and recommendations, at the time. [name] recent genetic test results were disclosed to her, as were recommendations warranted by these results. These results and recommendations are discussed in more detail below.  CANCER HISTORY:  [name] is a 39 y.o. female with no personal history of cancer.  She reports precancerous cells of the ovaries and cervix in her 93s and had a hysterectomy and oophorectomy.     FAMILY HISTORY:  We obtained a detailed, 4-generation family history.  Significant diagnoses are listed below: Family History  Problem Relation Age of Onset  . Other Mother        Breast lesion. Possible cancer? dx 35  . Multiple myeloma Maternal Grandmother        68  . Lung cancer Maternal Uncle 53  . Breast cancer Other        MGM's sister; dx 65s  . Pancreatic cancer Other        MGM's brother; dx 46s  . Lung cancer Other        MGM's brother; dx 54s  . Brain cancer Other        MGM's brother; dx 56s  . Breast cancer Other        MGM's niece; dx 36s      [name] has two daughters, ages 37 and 61, without a history of cancer.  [name] mother is 50.  She reports that her mother had a breast lesion at age 59 and reports that it was possibly cancer.  [name] has limited information about this lesion and her mother is unavailable for testing. [name] maternal aunt was diagnosed with lung cancer at age 83.  [name]'s maternal grandmother was diagnosed with multiple myeloma at age 26 and passed away at age 16.  Severals Erica Nichols's maternal grandmother's siblings were diagnosed with cancer--maternal great aunt diagnosed with breast cancer  in her 29s, maternal great uncle diagnosed with lung cancer in his 58s, paternal great uncle diagnosed with pancreatic cancer in his 65s, and maternal great uncle diagnosed with brain cancer in his 3s.  [name] also has a distant cousin diagnosed with breast cancer in her 23s who passed away in her 82s.  [name] reported uncertain paternity and thus has limited information about her paternal family history.   [name] is unaware of previous family history of genetic testing for hereditary cancer risks. Patient's maternal ancestors are of Native American and White/Caucasian descent, and paternal ancestors are of White/Caucasian descent. There is no reported Ashkenazi Jewish ancestry. There is no known consanguinity.  GENETIC TEST RESULTS: Genetic testing reported out on October 05, 2020.  The [facility] Multi-Cancer Panel found no pathogenic mutations. The Multi-Cancer Panel offered by [facility] includes sequencing and/or deletion duplication testing of the following 85 genes: AIP, ALK, APC, ATM, AXIN2,BAP1,  BARD1, BLM, BMPR1A, BRCA1, BRCA2, BRIP1, CASR, CDC73, CDH1, CDK4, CDKN1B, CDKN1C, CDKN2A (p14ARF), CDKN2A (p16INK4a), CEBPA, CHEK2, CTNNA1, DICER1, DIS3L2, EGFR (c.2369C>T, p.Thr790Met variant only), EPCAM (Deletion/duplication testing only), FH, FLCN, GATA2, GPC3, GREM1 (Promoter region deletion/duplication testing only), HOXB13 (c.251G>A, p.Gly84Glu), HRAS, KIT, MAX, MEN1, MET, MITF (c.952G>A, p.Glu318Lys variant only), MLH1, MSH2, MSH3, MSH6, MUTYH, NBN, NF1, NF2, NTHL1, PALB2, PDGFRA, PHOX2B, PMS2, POLD1, POLE, POT1, PRKAR1A, PTCH1,  PTEN, RAD50, RAD51C, RAD51D, RB1, RECQL4, RET, RNF43, RUNX1, SDHAF2, SDHA (sequence changes only), SDHB, SDHC, SDHD, SMAD4, SMARCA4, SMARCB1, SMARCE1, STK11, SUFU, TERC, TERT, TMEM127, TP53, TSC1, TSC2, VHL, WRN and WT1.    The test report has been scanned into EPIC and is located under the Molecular Pathology section of the Results Review tab.  A  portion of the result report is included below for reference.     We discussed with [name] that because current genetic testing is not perfect, it is possible there may be a gene mutation in one of these genes that current testing cannot detect, but that chance is small.  We also discussed, that there could be another gene that has not yet been discovered, or that we have not yet tested, that is responsible for the cancer diagnoses in the family. It is also possible there is a hereditary cause for the cancer in the family that [name] did not inherit and therefore was not identified in her testing.  Therefore, it is important to remain in touch with cancer genetics in the future so that we can continue to offer [name] the most up to date genetic testing.   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  At this time, it is unknown if this variant is associated with increased cancer risk or if this is a normal finding, but most variants such as this get reclassified to being inconsequential. It should not be used to make medical management decisions. With time, we suspect the lab will determine the significance of this variant, if any. If we do learn more about it, we will try to contact [name] to discuss it further. However, it is important to stay in touch with [place] periodically and keep the address and phone number up to date.  ADDITIONAL GENETIC TESTING: We discussed with [name] that her genetic testing was fairly extensive.  If there are genes identified to increase cancer risk that can be analyzed in the future, we would be happy to discuss and coordinate this testing at that time.    CANCER SCREENING RECOMMENDATIONS: [name] test result is considered negative (normal).  This means that we have not identified a hereditary cause for her family history of cancer  at this time. Most cancers happen by chance and this negative test suggests that her family's cancer may fall into this category.    While reassuring, this does not definitively rule out a hereditary predisposition to cancer. It is still possible that there could be genetic mutations that are undetectable by current technology. There could be genetic mutations in genes that have not been tested or identified to increase cancer risk.  Therefore, it is recommended she continue to follow the cancer management and screening guidelines provided by her primary healthcare provider.   An individual's cancer risk and medical management are not determined by genetic test results alone. Overall cancer risk assessment incorporates additional factors, including personal medical history, family history, and any available genetic information that may result in a personalized plan for cancer prevention and surveillance  RECOMMENDATIONS FOR FAMILY MEMBERS:  Individuals in this family might be at some increased risk of developing cancer, over the general population risk, simply due to the family history of cancer.  We recommended women in this family have a yearly mammogram beginning at age 42, or 69 years younger than the earliest onset of cancer, an annual  clinical breast exam, and perform monthly breast self-exams. Women in this family should also have a gynecological exam as recommended by their primary provider. All family members should be referred for colonoscopy starting at age 38.   It is also possible there is a hereditary cause for the cancer in [name]'s family that she did not inherit and therefore was not identified in her.  Based on [name]'s family history, we recommended her mother have genetic counseling and testing. [name] will let us know if we can be of any assistance in coordinating genetic counseling and/or testing for this family member.   FOLLOW-UP: Lastly, we discussed with  [name] that cancer genetics is a rapidly advancing field and it is possible that new genetic tests will be appropriate for her and/or her family members in the future. We encouraged her to remain in contact with cancer genetics on an annual basis so we can update her personal and family histories and let her know of advances in cancer genetics that may benefit this family.   Our contact number was provided. [name] questions were answered to her satisfaction, and she knows she is welcome to call us at anytime with additional questions or concerns.   Erica Nichols M. Joette Catching, [place], [place] Film/video editor.[email or web address] (P) [phone]

## 2020-12-12 ENCOUNTER — Ambulatory Visit: Payer: Commercial Managed Care - PPO | Admitting: Family Medicine

## 2020-12-12 ENCOUNTER — Telehealth: Payer: Commercial Managed Care - PPO | Admitting: Physician Assistant

## 2020-12-12 ENCOUNTER — Encounter: Payer: Self-pay | Admitting: Family Medicine

## 2020-12-12 DIAGNOSIS — R059 Cough, unspecified: Secondary | ICD-10-CM

## 2020-12-12 MED ORDER — BENZONATATE 100 MG PO CAPS: 100.0000 mg | ORAL_CAPSULE | Freq: Three times a day (TID) | ORAL | 0 refills | Status: DC | PRN

## 2020-12-12 MED ORDER — AZITHROMYCIN 250 MG PO TABS: ORAL_TABLET | ORAL | 0 refills | Status: DC

## 2020-12-12 NOTE — Progress Notes (Signed)
We are sorry that you are not feeling well.  Here is how we plan to help!  Based on your presentation I believe you most likely have A cough due to bacteria.  When patients have a fever and a productive cough with a change in color or increased sputum production, we are concerned about bacterial bronchitis.  If left untreated it can progress to pneumonia.  If your symptoms do not improve with your treatment plan it is important that you contact your provider.   I have prescribed Azithromyin 250 mg: two tablets now and then one tablet daily for 4 additonal days    In addition you may use A prescription cough medication called Tessalon Perles 100mg. You may take 1-2 capsules every 8 hours as needed for your cough.   From your responses in the eVisit questionnaire you describe inflammation in the upper respiratory tract which is causing a significant cough.  This is commonly called Bronchitis and has four common causes:    Allergies  Viral Infections  Acid Reflux  Bacterial Infection Allergies, viruses and acid reflux are treated by controlling symptoms or eliminating the cause. An example might be a cough caused by taking certain blood pressure medications. You stop the cough by changing the medication. Another example might be a cough caused by acid reflux. Controlling the reflux helps control the cough.  USE OF BRONCHODILATOR ("RESCUE") INHALERS: There is a risk from using your bronchodilator too frequently.  The risk is that over-reliance on a medication which only relaxes the muscles surrounding the breathing tubes can reduce the effectiveness of medications prescribed to reduce swelling and congestion of the tubes themselves.  Although you feel brief relief from the bronchodilator inhaler, your asthma may actually be worsening with the tubes becoming more swollen and filled with mucus.  This can delay other crucial treatments, such as oral steroid medications. If you need to use a  bronchodilator inhaler daily, several times per day, you should discuss this with your provider.  There are probably better treatments that could be used to keep your asthma under control.     HOME CARE . Only take medications as instructed by your medical team. . Complete the entire course of an antibiotic. . Drink plenty of fluids and get plenty of rest. . Avoid close contacts especially the very young and the elderly . Cover your mouth if you cough or cough into your sleeve. . Always remember to wash your hands . A steam or ultrasonic humidifier can help congestion.   GET HELP RIGHT AWAY IF: . You develop worsening fever. . You become short of breath . You cough up blood. . Your symptoms persist after you have completed your treatment plan MAKE SURE YOU   Understand these instructions.  Will watch your condition.  Will get help right away if you are not doing well or get worse.  Your e-visit answers were reviewed by a board certified advanced clinical practitioner to complete your personal care plan.  Depending on the condition, your plan could have included both over the counter or prescription medications. If there is a problem please reply  once you have received a response from your provider. Your safety is important to us.  If you have drug allergies check your prescription carefully.    You can use MyChart to ask questions about today's visit, request a non-urgent call back, or ask for a work or school excuse for 24 hours related to this e-Visit. If it has been   been greater than 24 hours you will need to follow up with your provider, or enter a new e-Visit to address those concerns. You will get an e-mail in the next two days asking about your experience.  I hope that your e-visit has been valuable and will speed your recovery. Thank you for using e-visits.  Greater than 5 minutes, yet less than 10 minutes of time have been spent researching, coordinating, and implementing care for  this patient today

## 2020-12-14 ENCOUNTER — Encounter: Payer: Self-pay | Admitting: Family Medicine

## 2020-12-14 ENCOUNTER — Telehealth: Payer: Commercial Managed Care - PPO | Admitting: Physician Assistant

## 2020-12-14 DIAGNOSIS — J069 Acute upper respiratory infection, unspecified: Secondary | ICD-10-CM

## 2020-12-14 DIAGNOSIS — R059 Cough, unspecified: Secondary | ICD-10-CM

## 2020-12-14 MED ORDER — ALBUTEROL SULFATE HFA 108 (90 BASE) MCG/ACT IN AERS: 2.0000 | INHALATION_SPRAY | RESPIRATORY_TRACT | 0 refills | Status: AC | PRN

## 2020-12-14 NOTE — Progress Notes (Signed)
Hi Kynadie,  I am sorry you are not feeling well.  Be sure you finish the entire course of the Azithromycin which was prescribed a few days ago.  Unfortunately, E-visit providers cannot prescribe cough medications with codeine -- you will need to contact your PCP for that request.   Be sure you stay well hydrated.    If you feel your symptoms are worsening, you may need a physical exam and possibly imaging or blood work. See below for our Urgent Care locations if this is the case.     NOTE: If you entered your credit card information for this eVisit, you will not be charged. You may see a "hold" on your card for the $35 but that hold will drop off and you will not have a charge processed.   If you are having a true medical emergency please call 911.      For an urgent face to face visit, [facility] has five urgent care centers for your convenience:     [facility] at [facility] Directions [phone] [address] [place], [place] [address]  10 am - 6pm Monday - Friday    [facility]) Get Driving Directions [phone] [address] [place], [place] [address]  10 am to 8 pm Monday-Friday  12 pm to 8 pm [facility] Urgent Care at [facility] Get Driving Directions [phone] [address], [address] [place], [place] [address]  8 am to 8 pm Monday-Friday  9 am to 6 pm Saturday  11 am to 6 pm Sunday     [facility] at [facility] Get Driving Directions  [phone] [address] [place], [place] [address]  8 am to 8 pm Monday-Friday  8 am to 4 pm [facility] Urgent Care at [facility] Directions [phone] [address] [place], [place] [address]  12 pm to 6 pm Monday-Friday      Your e-visit answers were reviewed by a board certified advanced clinical practitioner to complete  your personal care plan.  Thank you for using e-Visits.

## 2020-12-18 ENCOUNTER — Encounter: Payer: Self-pay | Admitting: Family Medicine

## 2021-01-03 ENCOUNTER — Ambulatory Visit: Payer: Commercial Managed Care - PPO | Admitting: Family Medicine

## 2021-01-16 ENCOUNTER — Other Ambulatory Visit: Payer: Self-pay | Admitting: Family Medicine

## 2021-01-16 DIAGNOSIS — F321 Major depressive disorder, single episode, moderate: Secondary | ICD-10-CM

## 2021-02-12 ENCOUNTER — Other Ambulatory Visit: Payer: Self-pay | Admitting: Family Medicine

## 2021-02-12 DIAGNOSIS — F321 Major depressive disorder, single episode, moderate: Secondary | ICD-10-CM

## 2021-03-30 IMAGING — MG MM BREAST LOCALIZATION CLIP
4 series · 4 of 12 positions shown · non-contrast
Comparison: Previous exam(s).

CLINICAL DATA: Status post stereotactic guided core biopsy of
calcifications in the LEFT breast.

EXAM:
DIAGNOSTIC LEFT MAMMOGRAM POST STEREOTACTIC BIOPSY

[L CC synth-2D]
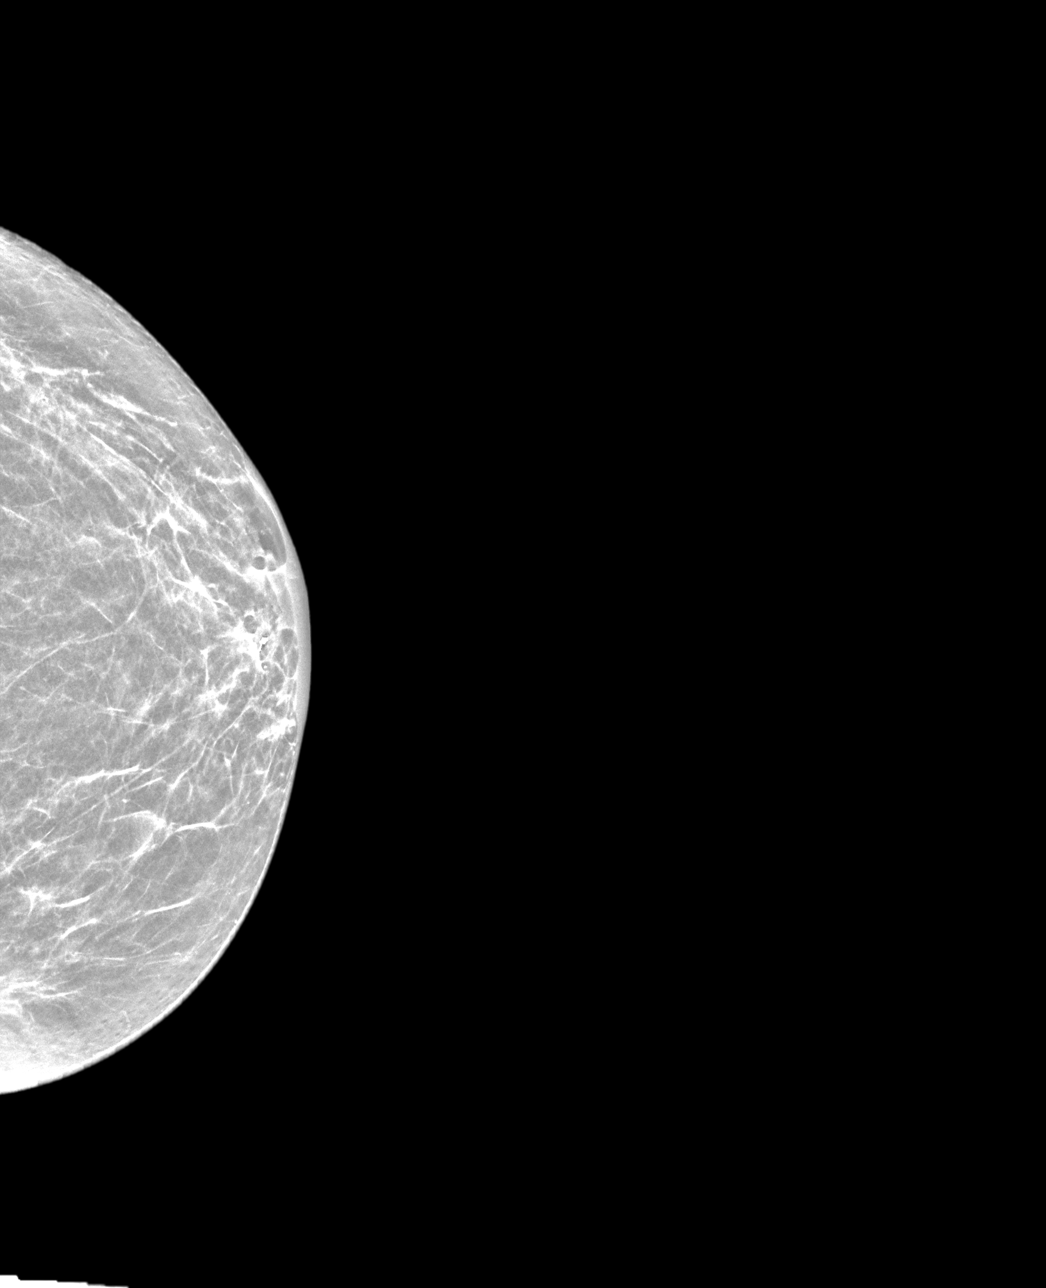

[L LM synth-2D]
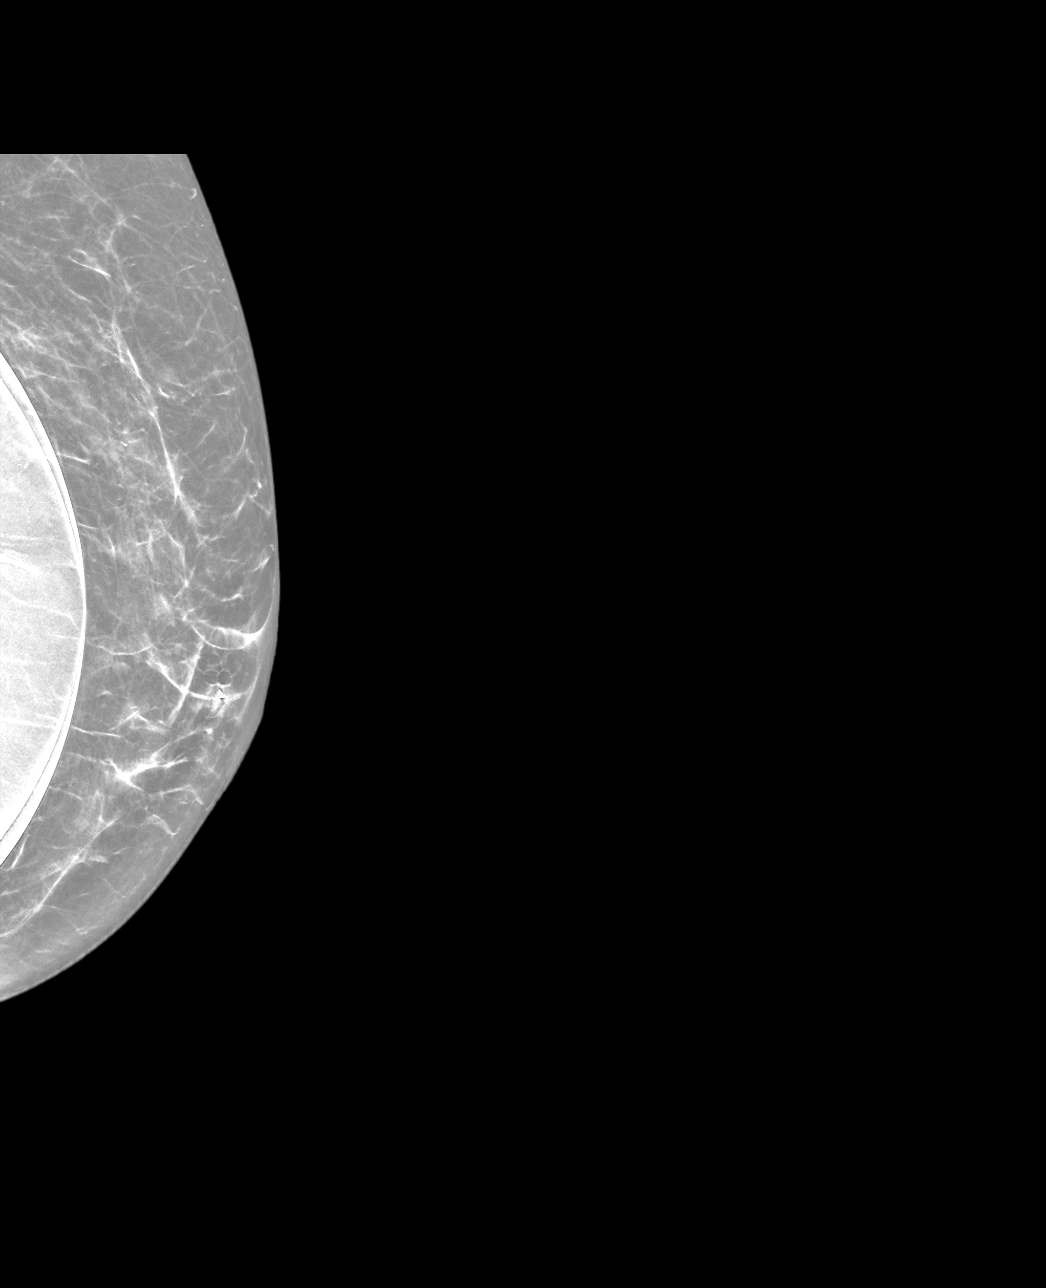

[L LM tomo · tomo slice 29/56.0]
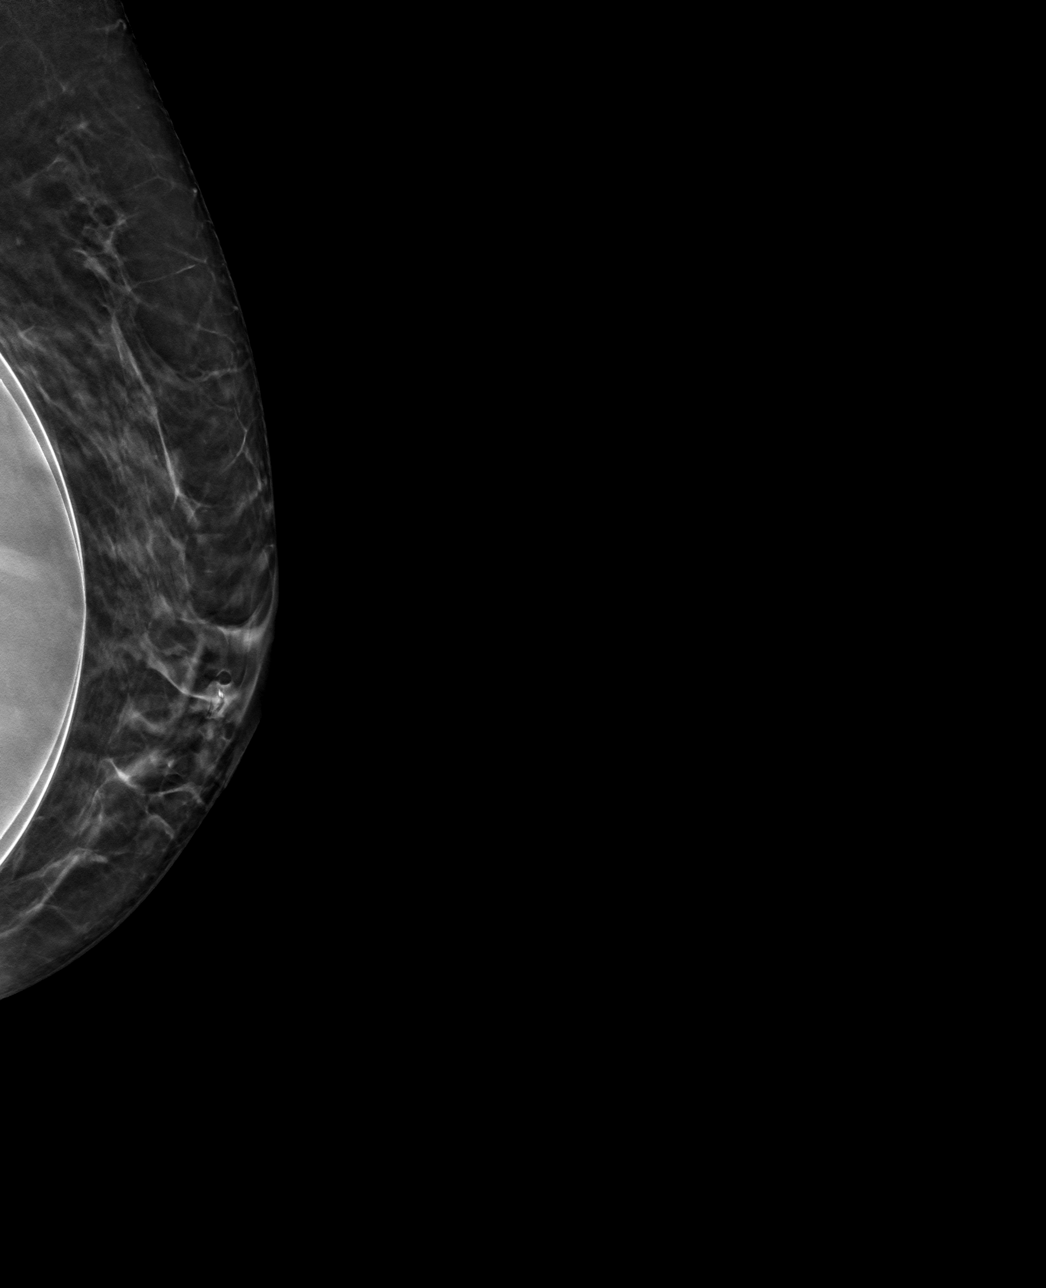

[L CC tomo · tomo slice 21/42.0]
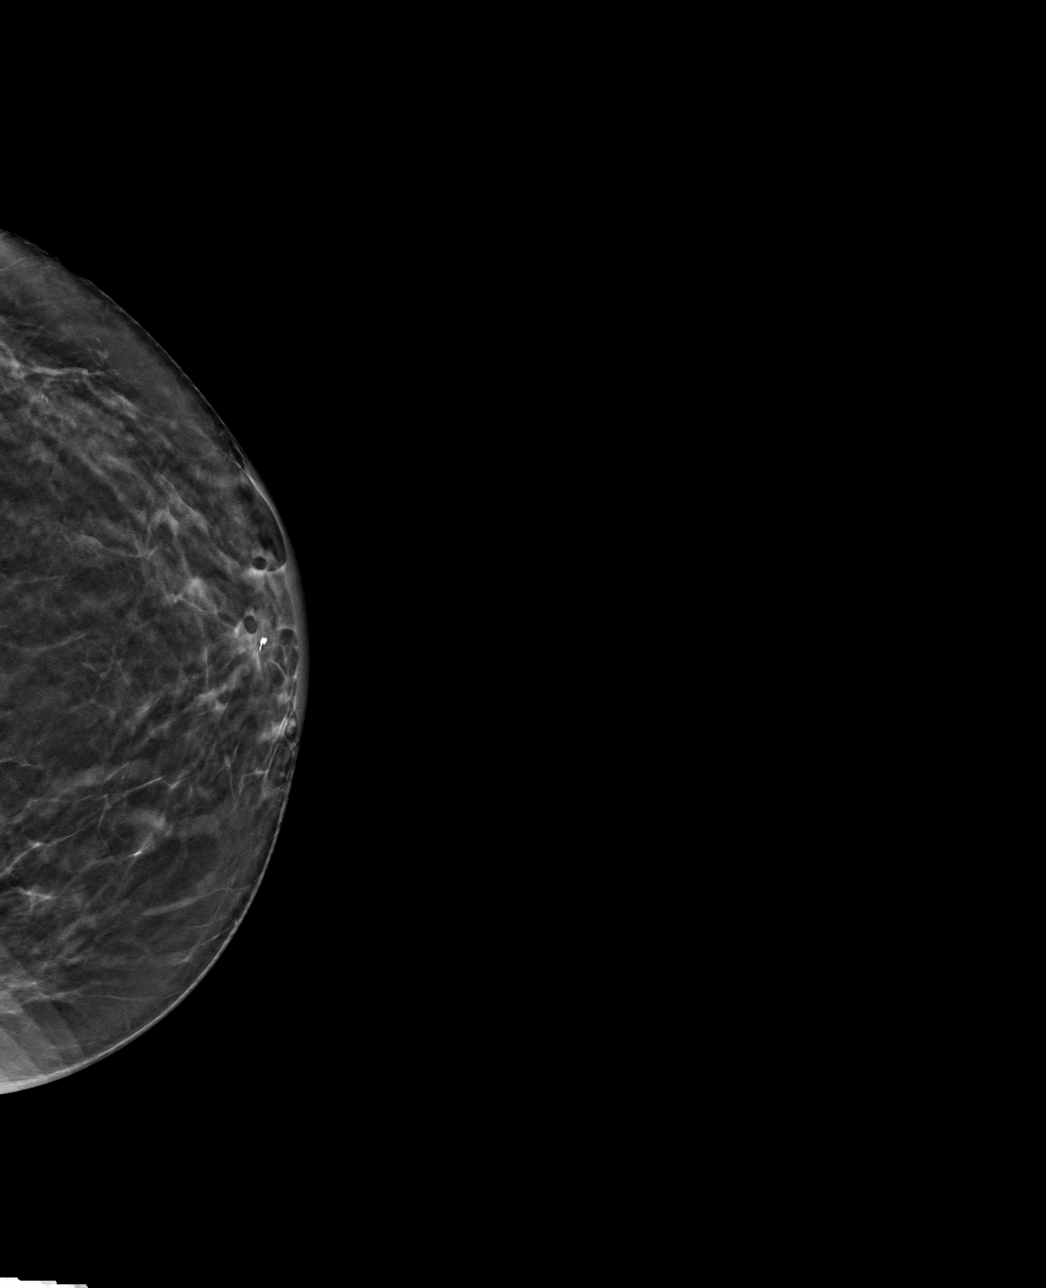

[4 of 12 positions shown; findings below may reference images not displayed]

FINDINGS: Mammographic images were obtained following stereotactic guided
biopsy of calcifications in the anterior aspect of the LEFT breast
and placement of a coil shaped clip. The biopsy marking clip is in
expected position at the site of biopsy. The coil shaped clip is
immediately adjacent to 2 residual calcifications, confirming
sampling of the desired area.
IMPRESSION: Appropriate positioning of the coil shaped biopsy marking clip at
the site of biopsy in the anterior LEFT breast.

Final Assessment: Post Procedure Mammograms for Marker Placement

## 2021-05-11 HISTORY — PX: ERCP: SHX60

## 2021-05-12 DIAGNOSIS — U071 COVID-19: Secondary | ICD-10-CM | POA: Insufficient documentation

## 2021-05-12 DIAGNOSIS — K859 Acute pancreatitis without necrosis or infection, unspecified: Secondary | ICD-10-CM | POA: Insufficient documentation

## 2021-05-21 ENCOUNTER — Telehealth: Payer: Self-pay | Admitting: Family Medicine

## 2021-05-21 NOTE — Telephone Encounter (Signed)
Transition Care Management Follow-up Telephone Call  Date of discharge and from where: [facility] 05/18/21  Diagnosis: Post-ESRD Acute Pancreatitis  How have you been since you were released from the hospital? Feels awful, nausea, reflux, no energy, no appetite, aching all over  Any questions or concerns? No  Items Reviewed:  Did the pt receive and understand the discharge instructions provided? Yes   Medications obtained and verified? Yes   Other? No   Any new allergies since your discharge? No   Dietary orders reviewed? Yes  Do you have support at home? Yes   Home Care and Equipment/Supplies: Were home health services ordered? no Were any new equipment or medical supplies ordered?  No  Functional Questionnaire: (I = Independent and D = Dependent) ADLs: I  Bathing/Dressing- I  Meal Prep- I  Eating- I  Maintaining continence- I  Transferring/Ambulation- I  Managing Meds- I  Follow up appointments reviewed:   PCP Hospital f/u appt confirmed? Yes  Scheduled to see [facility] on 05/23/21 @ 2:55.  Specialist Hospital f/u appt confirmed? No  will make appt to f/u with Dr Lorin Picket  Are transportation arrangements needed? No   If their condition worsens, is the pt aware to call PCP or go to the Emergency Dept.? Yes  Was the patient provided with contact information for the PCP's office or ED? Yes  Was to pt encouraged to call back with questions or concerns? Yes

## 2021-05-23 ENCOUNTER — Ambulatory Visit: Payer: Commercial Managed Care - PPO | Admitting: Family Medicine

## 2021-05-23 ENCOUNTER — Telehealth: Payer: Self-pay | Admitting: Family Medicine

## 2021-05-23 NOTE — Telephone Encounter (Signed)
HOSPITAL FOLLOW UP SCHEDULED AND PATIENT STATES SHE HAD REFILL ON TRAZODONE.

## 2021-05-23 NOTE — Telephone Encounter (Signed)
Pt was supposed to see Dr Darlyn Read today for hospital follow up but called and cancelled because she said she just saw her GI doctor who put in a STAT order for her to have CT scan done. Pt is on her way to [facility] now to have CT so she wont be done in time for her appt.  Please call patient to r/s hospital follow up.  Pt also wants to know if Dr Darlyn Read can call her in a Trazodone Refill to the drug store in [place]. Pt was advised to stop taking her amitriptyline and she is out of her trazadone and cant sleep.  Please advise.

## 2021-05-28 ENCOUNTER — Encounter: Payer: Self-pay | Admitting: Family Medicine

## 2021-05-28 ENCOUNTER — Inpatient Hospital Stay: Payer: Commercial Managed Care - PPO | Admitting: Nurse Practitioner

## 2021-06-04 ENCOUNTER — Other Ambulatory Visit: Payer: Self-pay

## 2021-06-04 ENCOUNTER — Ambulatory Visit: Payer: Commercial Managed Care - PPO | Admitting: Family Medicine

## 2021-06-04 ENCOUNTER — Encounter: Payer: Self-pay | Admitting: Family Medicine

## 2021-06-04 VITALS — BP 94/63 | HR 84 | Temp 97.7°F | Ht 63.0 in | Wt 144.2 lb

## 2021-06-04 DIAGNOSIS — K861 Other chronic pancreatitis: Secondary | ICD-10-CM | POA: Diagnosis not present

## 2021-06-04 NOTE — Progress Notes (Signed)
Subjective:  Patient ID: Erica Nichols, female    DOB: 22-Aug-1981  Age: 40 y.o. MRN: [id number]  CC: Hospitalization Follow-up   HPI Erica Nichols presents for folllow up of pain from ERCP 3-4 weeks ago. Taking pain meds and still having 5-6/10 pain. Points to medial right thoracic region over rhomboideus at about T10. Still nauseated. Had ERCP at [facility] on 5/27. Follow up with Her GI, Dr. Nicki Reaper at [facility] on 6/8. Using pain meds he prescribed. Reports from [facility] reviewed along with CT abd showing pancreatitis. She is concerned they gave her too much fluid and she developed heart failure. She remains SOB in that she can't take a deep breath without pain.  Depression screen [facility] 2/9 06/04/2021 06/04/2021 09/12/2020  Decreased Interest 0 0 0  Down, Depressed, Hopeless 1 0 0  PHQ - 2 Score 1 0 0  Altered sleeping 1 - -  Tired, decreased energy 1 - -  Change in appetite 1 - -  Feeling bad or failure about yourself  1 - -  Trouble concentrating 1 - -  Moving slowly or fidgety/restless 0 - -  Suicidal thoughts 0 - -  PHQ-9 Score 6 - -  Difficult doing work/chores Not difficult at all - -    History Erica Nichols has a past medical history of Acid reflux, ADHD, Allergy, Anxiety, Depression, Family history of breast cancer (09/28/2020), Family history of lung cancer (09/28/2020), Family history of multiple myeloma (09/28/2020), Family history of pancreatic cancer (09/28/2020), Fibromyalgia (2012), and Interstitial cystitis.   She has a past surgical history that includes Appendectomy; Cholecystectomy; Abdominal hysterectomy; ERCP (08/2016); Arthroscopic repair ACL; Augmentation mammaplasty; and ERCP (05/11/2021).   Her family history includes ADD / ADHD in her brother, daughter, and daughter; Alcohol abuse in her maternal grandfather; Arthritis in her maternal grandmother; Asthma in her daughter; Brain cancer in an other family member; Breast cancer in some other family  members; COPD in her maternal grandmother; Depression in her mother; Diabetes in her father and maternal grandmother; Drug abuse in her brother and mother; Heart disease in her maternal grandmother; Hyperlipidemia in her maternal grandmother; Hypertension in her maternal grandmother and mother; Learning disabilities in her brother and daughter; Lung cancer in an other family member; Lung cancer (age of onset: 77) in her maternal uncle; Mood Disorder in her daughter; Multiple myeloma in her maternal grandmother; Other in her mother; Pancreatic cancer in an other family member.She reports that she has never smoked. She has never used smokeless tobacco. She reports that she does not drink alcohol and does not use drugs.    ROS Review of Systems  Constitutional: Negative.   HENT: Negative.    Eyes:  Negative for visual disturbance.  Respiratory:  Negative for shortness of breath.   Cardiovascular:  Negative for chest pain.  Gastrointestinal:  Negative for abdominal pain.  Musculoskeletal:  Negative for arthralgias.   Objective:  BP 94/63   Pulse 84   Temp 97.7 F (36.5 C)   Ht 5' 3" (1.6 m)   Wt 144 lb 3.2 oz (65.4 kg)   SpO2 96%   BMI 25.54 kg/m   BP Readings from Last 3 Encounters:  06/04/21 94/63  09/12/20 117/66  08/28/20 96/72    Wt Readings from Last 3 Encounters:  06/04/21 144 lb 3.2 oz (65.4 kg)  09/12/20 146 lb (66.2 kg)  08/28/20 146 lb (66.2 kg)     Physical Exam Constitutional:      General:  She is not in acute distress.    Appearance: She is well-developed.  HENT:     Head: Normocephalic and atraumatic.  Eyes:     Conjunctiva/sclera: Conjunctivae normal.     Pupils: Pupils are equal, round, and reactive to light.  Neck:     Thyroid: No thyromegaly.  Cardiovascular:     Rate and Rhythm: Normal rate and regular rhythm.     Heart sounds: Normal heart sounds. No murmur heard. Pulmonary:     Effort: Pulmonary effort is normal. No respiratory distress.      Breath sounds: Normal breath sounds. No wheezing or rales.  Abdominal:     General: Bowel sounds are normal. There is no distension.     Palpations: Abdomen is soft.     Tenderness: There is no abdominal tenderness.  Musculoskeletal:        General: Normal range of motion.     Cervical back: Normal range of motion and neck supple.  Lymphadenopathy:     Cervical: No cervical adenopathy.  Skin:    General: Skin is warm and dry.  Neurological:     Mental Status: She is alert and oriented to person, place, and time.  Psychiatric:        Behavior: Behavior normal.        Thought Content: Thought content normal.        Judgment: Judgment normal.      Assessment & Plan:   Erica Nichols was seen today for hospitalization follow-up.  Diagnoses and all orders for this visit:  Chronic pancreatitis, unspecified pancreatitis type ([place]) -     CBC with Differential/Platelet -     CMP14+EGFR -     Lipase -     US Abdomen Complete; Future -     Amylase      I have discontinued Erica Nichols's azithromycin and benzonatate. I am also having her maintain her valACYclovir, diclofenac, estradiol, lisdexamfetamine, lisdexamfetamine, lisdexamfetamine, esomeprazole, albuterol, escitalopram, traZODone, amitriptyline, furosemide, oxyCODONE, and promethazine.  Allergies as of 06/04/2021       Reactions   Latex Itching, Rash   Sulfa Antibiotics Shortness Of Breath, Rash   Sulfamethoxazole-trimethoprim Hives, Palpitations, Rash, Shortness Of Breath, Swelling   Other reaction(s): Redness   Oxycodone-acetaminophen Nausea And Vomiting   Macrobid [nitrofurantoin Monohyd Macro] Nausea And Vomiting        Medication List        Accurate as of June 04, 2021  8:57 PM. If you have any questions, ask your nurse or doctor.          STOP taking these medications    azithromycin 250 MG tablet Commonly known as: ZITHROMAX Stopped by: [name], MD   benzonatate 100 MG  capsule Commonly known as: Best boy Stopped by: [name], MD       TAKE these medications    albuterol 108 (90 Base) MCG/ACT inhaler Commonly known as: VENTOLIN HFA Inhale 2 puffs into the lungs every 4 (four) hours as needed for wheezing or shortness of breath (cough, shortness of breath or wheezing.).   amitriptyline 25 MG tablet Commonly known as: ELAVIL Take by mouth.   diclofenac 75 MG EC tablet Commonly known as: VOLTAREN Take 1 tablet (75 mg total) by mouth 2 (two) times daily. For muscle and  Joint pain   escitalopram 10 MG tablet Commonly known as: LEXAPRO Take 1 tablet (10 mg total) by mouth daily. (NEEDS TO BE SEEN BEFORE NEXT REFILL)   esomeprazole 40 MG capsule  Commonly known as: NexIUM Take 1 capsule (40 mg total) by mouth daily.   estradiol 0.5 MG tablet Commonly known as: ESTRACE Take by mouth.   furosemide 40 MG tablet Commonly known as: LASIX Take by mouth.   lisdexamfetamine 20 MG capsule Commonly known as: Vyvanse Take 1 capsule (20 mg total) by mouth every morning.   lisdexamfetamine 20 MG capsule Commonly known as: Vyvanse Take 1 capsule (20 mg total) by mouth every morning.   lisdexamfetamine 20 MG capsule Commonly known as: Vyvanse Take 1 capsule (20 mg total) by mouth every morning.   oxyCODONE 5 MG immediate release tablet Commonly known as: Oxy IR/ROXICODONE Take by mouth.   promethazine 25 MG tablet Commonly known as: PHENERGAN Take by mouth.   traZODone 50 MG tablet Commonly known as: DESYREL TAKE 1/3 TO 1 TABLET NIGHTLY AS NEEDED FOR SLEEP   valACYclovir 500 MG tablet Commonly known as: VALTREX Take 1 tablet (500 mg total) by mouth every other day.         Follow-up: No follow-ups on file.  [name], M.D.

## 2021-06-05 LAB — CMP14+EGFR
ALT: 22 IU/L (ref 0–32)
AST: 20 IU/L (ref 0–40)
Albumin/Globulin Ratio: 1.9 (ref 1.2–2.2)
Albumin: 4.3 g/dL (ref 3.8–4.8)
Alkaline Phosphatase: 66 IU/L (ref 44–121)
BUN/Creatinine Ratio: 11 (ref 9–23)
BUN: 10 mg/dL (ref 6–24)
Bilirubin Total: 0.2 mg/dL (ref 0.0–1.2)
CO2: 24 mmol/L (ref 20–29)
Calcium: 9.5 mg/dL (ref 8.7–10.2)
Chloride: 106 mmol/L (ref 96–106)
Creatinine, Ser: 0.87 mg/dL (ref 0.57–1.00)
Globulin, Total: 2.3 g/dL (ref 1.5–4.5)
Glucose: 91 mg/dL (ref 65–99)
Potassium: 4.6 mmol/L (ref 3.5–5.2)
Sodium: 142 mmol/L (ref 134–144)
Total Protein: 6.6 g/dL (ref 6.0–8.5)
eGFR: 86 mL/min/{1.73_m2} (ref >59)

## 2021-06-05 LAB — LIPASE: Lipase: 61 U/L (ref 14–72)

## 2021-06-05 LAB — AMYLASE: Amylase: 73 U/L (ref 31–110)

## 2021-06-05 LAB — CBC WITH DIFFERENTIAL/PLATELET
Basophils Absolute: 0 10*3/uL (ref 0.0–0.2)
Basos: 0 %
EOS (ABSOLUTE): 0.1 10*3/uL (ref 0.0–0.4)
Eos: 4 %
Hematocrit: 38.6 % (ref 34.0–46.6)
Hemoglobin: 12.7 g/dL (ref 11.1–15.9)
Immature Grans (Abs): 0 10*3/uL (ref 0.0–0.1)
Immature Granulocytes: 0 %
Lymphocytes Absolute: 1.3 10*3/uL (ref 0.7–3.1)
Lymphs: 35 %
MCH: 28.8 pg (ref 26.6–33.0)
MCHC: 32.9 g/dL (ref 31.5–35.7)
MCV: 88 fL (ref 79–97)
Monocytes Absolute: 0.3 10*3/uL (ref 0.1–0.9)
Monocytes: 9 %
Neutrophils Absolute: 1.9 10*3/uL (ref 1.4–7.0)
Neutrophils: 52 %
Platelets: 446 10*3/uL (ref 150–450)
RBC: 4.41 x10E6/uL (ref 3.77–5.28)
RDW: 13 % (ref 11.7–15.4)
WBC: 3.6 10*3/uL (ref 3.4–10.8)

## 2021-06-05 NOTE — Progress Notes (Signed)
Hello Erica Nichols,  Your lab result is normal and/or stable.Some minor variations that are not significant are commonly marked abnormal, but do not represent any medical problem for you.  Best regards, Mechele Claude, M.D.

## 2021-06-27 ENCOUNTER — Other Ambulatory Visit: Payer: Self-pay | Admitting: Family Medicine

## 2021-07-21 ENCOUNTER — Other Ambulatory Visit: Payer: Self-pay | Admitting: Family Medicine

## 2021-07-21 DIAGNOSIS — F900 Attention-deficit hyperactivity disorder, predominantly inattentive type: Secondary | ICD-10-CM

## 2021-07-23 ENCOUNTER — Other Ambulatory Visit: Payer: Self-pay | Admitting: Family Medicine

## 2021-07-23 ENCOUNTER — Telehealth: Payer: Self-pay | Admitting: *Deleted

## 2021-07-23 DIAGNOSIS — F900 Attention-deficit hyperactivity disorder, predominantly inattentive type: Secondary | ICD-10-CM

## 2021-07-23 NOTE — Telephone Encounter (Signed)
Drug Esomeprazole Magnesium 40MG  dr capsules Form [facility] [employer] Plan Prescription Drug Prior Authorization Form General Prior Authorizations [id number] [phone] Original Claim Info 75  (Key: [place]) - [place]   Sent to Plan today [employer] The plan will fax you a determination, typically within 1 to 5 business days.

## 2021-07-25 ENCOUNTER — Telehealth: Payer: Self-pay | Admitting: Family Medicine

## 2021-07-25 NOTE — Telephone Encounter (Signed)
  Prescription Request  07/25/2021  What is the name of the medication or equipment? Vyvanse  Have you contacted your pharmacy to request a refill? (if applicable) yes  Which pharmacy would you like this sent to[facility] [place]   Patient notified that their request is being sent to the clinical staff for review and that they should receive a response within 2 business days.     *Pt aware she may need appt. Appt already made for med refill for 8/16 but she still wanted a message put in to see if she could get without an appt

## 2021-07-26 NOTE — Telephone Encounter (Signed)
LMOVM will have to be filled at 07/31/21 d/t controlled substance policy

## 2021-07-31 ENCOUNTER — Ambulatory Visit: Payer: Commercial Managed Care - PPO | Admitting: Family Medicine

## 2021-07-31 NOTE — Telephone Encounter (Signed)
PA denied this medication is excluded as a plan benefit.

## 2021-08-01 ENCOUNTER — Other Ambulatory Visit: Payer: Self-pay | Admitting: Family Medicine

## 2021-08-01 MED ORDER — PANTOPRAZOLE SODIUM 40 MG PO TBEC: 40.0000 mg | DELAYED_RELEASE_TABLET | Freq: Every day | ORAL | 3 refills | Status: AC

## 2021-08-01 NOTE — Telephone Encounter (Signed)
I sent in a replacement 

## 2021-08-01 NOTE — Telephone Encounter (Signed)
Lmtcb.

## 2021-08-06 ENCOUNTER — Encounter: Payer: Self-pay | Admitting: Family Medicine
